# Patient Record
Sex: Female | Born: 1990 | Race: Black or African American | Hispanic: No | Marital: Single | Smoking: Smoker, current status unknown
Health system: Southern US, Community
[De-identification: ages and names within clinical notes are randomized; demographics above are authoritative.]

## PROBLEM LIST (undated history)

## (undated) DIAGNOSIS — F32A Depression, unspecified: Secondary | ICD-10-CM

## (undated) DIAGNOSIS — R569 Unspecified convulsions: Secondary | ICD-10-CM

## (undated) HISTORY — PX: NO PAST SURGERIES: SHX2092

## (undated) HISTORY — PX: INDUCED ABORTION: SHX677

---

## 2014-09-20 ENCOUNTER — Encounter (HOSPITAL_COMMUNITY): Payer: Self-pay

## 2014-09-20 ENCOUNTER — Emergency Department (HOSPITAL_COMMUNITY)
Admission: EM | Admit: 2014-09-20 | Discharge: 2014-09-20 | Disposition: A | Discharge status: Home / Self Care | Payer: Medicaid Other | Attending: Emergency Medicine | Admitting: Emergency Medicine

## 2014-09-20 ENCOUNTER — Emergency Department (HOSPITAL_COMMUNITY): Payer: Medicaid Other

## 2014-09-20 DIAGNOSIS — Z3202 Encounter for pregnancy test, result negative: Secondary | ICD-10-CM | POA: Diagnosis not present

## 2014-09-20 DIAGNOSIS — R1030 Lower abdominal pain, unspecified: Secondary | ICD-10-CM | POA: Insufficient documentation

## 2014-09-20 DIAGNOSIS — Z720 Tobacco use: Secondary | ICD-10-CM | POA: Diagnosis not present

## 2014-09-20 DIAGNOSIS — N939 Abnormal uterine and vaginal bleeding, unspecified: Secondary | ICD-10-CM | POA: Insufficient documentation

## 2014-09-20 DIAGNOSIS — R109 Unspecified abdominal pain: Secondary | ICD-10-CM

## 2014-09-20 DIAGNOSIS — R110 Nausea: Secondary | ICD-10-CM | POA: Diagnosis not present

## 2014-09-20 HISTORY — DX: Unspecified convulsions: R56.9

## 2014-09-20 LAB — URINE MICROSCOPIC-ADD ON

## 2014-09-20 LAB — COMPREHENSIVE METABOLIC PANEL
ALK PHOS: 81 U/L (ref 39–117)
ALT: 16 U/L (ref 0–35)
AST: 22 U/L (ref 0–37)
Albumin: 4.9 g/dL (ref 3.5–5.2)
Anion gap: 19 — ABNORMAL HIGH (ref 5–15)
BILIRUBIN TOTAL: 0.3 mg/dL (ref 0.3–1.2)
BUN: 5 mg/dL — ABNORMAL LOW (ref 6–23)
CO2: 22 mEq/L (ref 19–32)
Calcium: 10.8 mg/dL — ABNORMAL HIGH (ref 8.4–10.5)
Chloride: 102 mEq/L (ref 96–112)
Creatinine, Ser: 0.88 mg/dL (ref 0.50–1.10)
GFR calc non Af Amer: >90 mL/min (ref >90)
Glucose, Bld: 112 mg/dL — ABNORMAL HIGH (ref 70–99)
POTASSIUM: 3.5 meq/L — AB (ref 3.7–5.3)
Sodium: 143 mEq/L (ref 137–147)
TOTAL PROTEIN: 9.3 g/dL — AB (ref 6.0–8.3)

## 2014-09-20 LAB — CBC WITH DIFFERENTIAL/PLATELET
Basophils Absolute: 0 10*3/uL (ref 0.0–0.1)
Basophils Relative: 0 % (ref 0–1)
EOS PCT: 0 % (ref 0–5)
Eosinophils Absolute: 0 10*3/uL (ref 0.0–0.7)
HEMATOCRIT: 42.1 % (ref 36.0–46.0)
Hemoglobin: 13.8 g/dL (ref 12.0–15.0)
Lymphocytes Relative: 12 % (ref 12–46)
Lymphs Abs: 1.3 10*3/uL (ref 0.7–4.0)
MCH: 26.8 pg (ref 26.0–34.0)
MCHC: 32.8 g/dL (ref 30.0–36.0)
MCV: 81.9 fL (ref 78.0–100.0)
Monocytes Absolute: 0.3 10*3/uL (ref 0.1–1.0)
Monocytes Relative: 3 % (ref 3–12)
NEUTROS PCT: 85 % — AB (ref 43–77)
Neutro Abs: 9.4 10*3/uL — ABNORMAL HIGH (ref 1.7–7.7)
Platelets: 398 10*3/uL (ref 150–400)
RBC: 5.14 MIL/uL — AB (ref 3.87–5.11)
RDW: 17.7 % — ABNORMAL HIGH (ref 11.5–15.5)
WBC: 11 10*3/uL — ABNORMAL HIGH (ref 4.0–10.5)

## 2014-09-20 LAB — URINALYSIS, ROUTINE W REFLEX MICROSCOPIC
BILIRUBIN URINE: NEGATIVE
Glucose, UA: NEGATIVE mg/dL
Ketones, ur: 15 mg/dL — AB
Leukocytes, UA: NEGATIVE
Nitrite: NEGATIVE
PROTEIN: 30 mg/dL — AB
Specific Gravity, Urine: 1.025 (ref 1.005–1.030)
UROBILINOGEN UA: 0.2 mg/dL (ref 0.0–1.0)
pH: 8.5 — ABNORMAL HIGH (ref 5.0–8.0)

## 2014-09-20 LAB — WET PREP, GENITAL
Trich, Wet Prep: NONE SEEN
WBC WET PREP: NONE SEEN
Yeast Wet Prep HPF POC: NONE SEEN

## 2014-09-20 LAB — POC URINE PREG, ED: Preg Test, Ur: NEGATIVE

## 2014-09-20 MED ORDER — METRONIDAZOLE 500 MG PO TABS: 500.0000 mg | ORAL_TABLET | Freq: Two times a day (BID) | ORAL | Status: DC

## 2014-09-20 MED ORDER — HYDROMORPHONE HCL 1 MG/ML IJ SOLN
1.0000 mg | Freq: Once | INTRAMUSCULAR | Status: AC
  Administered 2014-09-20: 1 mg via INTRAVENOUS
  Filled 2014-09-20: qty 1

## 2014-09-20 MED ORDER — STERILE WATER FOR INJECTION IJ SOLN
INTRAMUSCULAR | Status: AC
  Administered 2014-09-20: 2.1 mL
  Filled 2014-09-20: qty 10

## 2014-09-20 MED ORDER — ONDANSETRON HCL 4 MG/2ML IJ SOLN
4.0000 mg | Freq: Once | INTRAMUSCULAR | Status: AC
  Administered 2014-09-20: 4 mg via INTRAVENOUS
  Filled 2014-09-20: qty 2

## 2014-09-20 MED ORDER — AZITHROMYCIN 250 MG PO TABS
1000.0000 mg | ORAL_TABLET | Freq: Once | ORAL | Status: AC
  Administered 2014-09-20: 1000 mg via ORAL
  Filled 2014-09-20: qty 4

## 2014-09-20 MED ORDER — SODIUM CHLORIDE 0.9 % IV BOLUS (SEPSIS)
1000.0000 mL | Freq: Once | INTRAVENOUS | Status: AC
  Administered 2014-09-20: 1000 mL via INTRAVENOUS

## 2014-09-20 MED ORDER — CEFTRIAXONE SODIUM 250 MG IJ SOLR
250.0000 mg | Freq: Once | INTRAMUSCULAR | Status: AC
  Administered 2014-09-20: 250 mg via INTRAMUSCULAR
  Filled 2014-09-20: qty 250

## 2014-09-20 MED ORDER — IOHEXOL 300 MG/ML  SOLN
100.0000 mL | Freq: Once | INTRAMUSCULAR | Status: AC | PRN
  Administered 2014-09-20: 100 mL via INTRAVENOUS

## 2014-09-20 MED ORDER — IOHEXOL 300 MG/ML  SOLN
50.0000 mL | Freq: Once | INTRAMUSCULAR | Status: AC | PRN
  Administered 2014-09-20: 50 mL via ORAL

## 2014-09-20 NOTE — ED Provider Notes (Signed)
CSN: [id number]     Arrival date & time 09/20/14  1705 History   First MD Initiated Contact with Patient 09/20/14 1736     Chief Complaint  Patient presents with  . Abdominal Pain     (Consider location/radiation/quality/duration/timing/severity/associated sxs/prior Treatment) Patient is a 23 y.o. female presenting with abdominal pain. The history is provided by the patient.  Abdominal Pain Associated symptoms: nausea and vaginal bleeding   Associated symptoms: no chest pain, no chills, no cough, no diarrhea, no dysuria, no fever, no shortness of breath, no sore throat, no vaginal discharge and no vomiting   pt c/o bilateral lower abdominal pain for the past couple days. Mod-severe. Constant, non radiating. Worse w movement. Denies hx same pain.  Nausea, no vomiting. Having normal bms. No dysuria or urgency. No back or flank pain. Having minimal vaginal bleeding, denies other discharge. States had normal period 2 weeks ago.  Denies hx pid/pelvic infection. No known std exposure. No hx ovarian cysts or endometriosis. Denies any prior abd surgery. No fever or chills.      Past Medical History  Diagnosis Date  . Seizures    Past Surgical History  Procedure Laterality Date  . Induced abortion     History reviewed. No pertinent family history. History  Substance Use Topics  . Smoking status: Current Every Day Smoker  . Smokeless tobacco: Not on file  . Alcohol Use: Yes     Comment: social   OB History    No data available     Review of Systems  Constitutional: Negative for fever and chills.  HENT: Negative for sore throat.   Eyes: Negative for redness.  Respiratory: Negative for cough and shortness of breath.   Cardiovascular: Negative for chest pain.  Gastrointestinal: Positive for nausea and abdominal pain. Negative for vomiting and diarrhea.  Endocrine: Negative for polyuria.  Genitourinary: Positive for vaginal bleeding. Negative for dysuria, flank pain and vaginal  discharge.  Musculoskeletal: Negative for back pain and neck pain.  Skin: Negative for rash.  Neurological: Negative for headaches.  Hematological: Does not bruise/bleed easily.  Psychiatric/Behavioral: Negative for confusion.      Allergies  Review of patient's allergies indicates no known allergies.  Home Medications   Prior to Admission medications   Not on File   BP 116/79 mmHg  Pulse 78  Temp(Src) 98.4 F (36.9 C) (Oral)  Resp 20  SpO2 100%  LMP 09/07/2014 Physical Exam  Constitutional: She is oriented to person, place, and time. She appears well-developed and well-nourished. No distress.  HENT:  Mouth/Throat: Oropharynx is clear and moist.  Eyes: Conjunctivae are normal. No scleral icterus.  Neck: Neck supple. No tracheal deviation present.  Cardiovascular: Normal rate, regular rhythm, normal heart sounds and intact distal pulses.  Exam reveals no gallop and no friction rub.   No murmur heard. Pulmonary/Chest: Effort normal and breath sounds normal. No respiratory distress.  Abdominal: Soft. Normal appearance and bowel sounds are normal. She exhibits no distension and no mass. There is tenderness. There is no rebound and no guarding.  Moderate bil lower abd tenderness.   Genitourinary:  No cva tenderness   Musculoskeletal: Normal range of motion. She exhibits no edema or tenderness.  Neurological: She is alert and oriented to person, place, and time.  Skin: Skin is warm and dry. No rash noted. She is not diaphoretic.  Psychiatric: She has a normal mood and affect.  Nursing note and vitals reviewed.   ED Course  Procedures (including critical  care time) Labs Review  Results for orders placed or performed during the hospital encounter of 09/20/14  Wet prep, genital  Result Value Ref Range   Yeast Wet Prep HPF POC NONE SEEN NONE SEEN   Trich, Wet Prep NONE SEEN NONE SEEN   Clue Cells Wet Prep HPF POC MODERATE (A) NONE SEEN   WBC, Wet Prep HPF POC NONE SEEN  NONE SEEN  CBC with Differential  Result Value Ref Range   WBC 11.0 (H) 4.0 - 10.5 K/uL   RBC 5.14 (H) 3.87 - 5.11 MIL/uL   Hemoglobin 13.8 12.0 - 15.0 g/dL   HCT 16.1 09.6 - 04.5 %   MCV 81.9 78.0 - 100.0 fL   MCH 26.8 26.0 - 34.0 pg   MCHC 32.8 30.0 - 36.0 g/dL   RDW 40.9 (H) 81.1 - 91.4 %   Platelets 398 150 - 400 K/uL   Neutrophils Relative % 85 (H) 43 - 77 %   Neutro Abs 9.4 (H) 1.7 - 7.7 K/uL   Lymphocytes Relative 12 12 - 46 %   Lymphs Abs 1.3 0.7 - 4.0 K/uL   Monocytes Relative 3 3 - 12 %   Monocytes Absolute 0.3 0.1 - 1.0 K/uL   Eosinophils Relative 0 0 - 5 %   Eosinophils Absolute 0.0 0.0 - 0.7 K/uL   Basophils Relative 0 0 - 1 %   Basophils Absolute 0.0 0.0 - 0.1 K/uL  Comprehensive metabolic panel  Result Value Ref Range   Sodium 143 137 - 147 mEq/L   Potassium 3.5 (L) 3.7 - 5.3 mEq/L   Chloride 102 96 - 112 mEq/L   CO2 22 19 - 32 mEq/L   Glucose, Bld 112 (H) 70 - 99 mg/dL   BUN 5 (L) 6 - 23 mg/dL   Creatinine, Ser 7.82 0.50 - 1.10 mg/dL   Calcium 95.6 (H) 8.4 - 10.5 mg/dL   Total Protein 9.3 (H) 6.0 - 8.3 g/dL   Albumin 4.9 3.5 - 5.2 g/dL   AST 22 0 - 37 U/L   ALT 16 0 - 35 U/L   Alkaline Phosphatase 81 39 - 117 U/L   Total Bilirubin 0.3 0.3 - 1.2 mg/dL   GFR calc non Af Amer >90 >90 mL/min   GFR calc Af Amer >90 >90 mL/min   Anion gap 19 (H) 5 - 15  Urinalysis, Routine w reflex microscopic  Result Value Ref Range   Color, Urine YELLOW YELLOW   APPearance CLOUDY (A) CLEAR   Specific Gravity, Urine 1.025 1.005 - 1.030   pH 8.5 (H) 5.0 - 8.0   Glucose, UA NEGATIVE NEGATIVE mg/dL   Hgb urine dipstick MODERATE (A) NEGATIVE   Bilirubin Urine NEGATIVE NEGATIVE   Ketones, ur 15 (A) NEGATIVE mg/dL   Protein, ur 30 (A) NEGATIVE mg/dL   Urobilinogen, UA 0.2 0.0 - 1.0 mg/dL   Nitrite NEGATIVE NEGATIVE   Leukocytes, UA NEGATIVE NEGATIVE  Urine microscopic-add on  Result Value Ref Range   Squamous Epithelial / LPF MANY (A) RARE   WBC, UA 0-2 <3 WBC/hpf    Bacteria, UA FEW (A) RARE   Urine-Other MUCOUS PRESENT   POC Urine Pregnancy, ED (do NOT order at [facility])  Result Value Ref Range   Preg Test, Ur NEGATIVE NEGATIVE   Ct Abdomen Pelvis W Contrast  09/20/2014   CLINICAL DATA:  Generalized abdominal pain.  EXAM: CT ABDOMEN AND PELVIS WITH CONTRAST  TECHNIQUE: Multidetector CT imaging of the abdomen and pelvis was  performed using the standard protocol following bolus administration of intravenous contrast.  CONTRAST:  50mL OMNIPAQUE IOHEXOL 300 MG/ML SOLN, 100mL OMNIPAQUE IOHEXOL 300 MG/ML SOLN  COMPARISON:  CT scan of Mar 08, 2014.  FINDINGS: Visualized lung bases appear normal. No significant osseous abnormality is noted.  No gallstones are noted. The liver, spleen and pancreas appear normal. Adrenal glands and kidneys appear normal. No hydronephrosis or renal obstruction is noted. No renal or ureteral calculi are noted. The appendix appears normal. There is no evidence of bowel obstruction. No abnormal fluid collection is noted. Urinary bladder appears normal. Uterus and ovaries appear normal. Minimal amount of free fluid is seen in the posterior cul-de-sac of the pelvis which most likely is physiologic. Minimally enlarged mesenteric lymph nodes are noted in the right lower quadrant most likely inflammatory in origin.  IMPRESSION: Minimally enlarged right lower quadrant mesenteric lymph nodes are noted most likely inflammatory in origin. No other significant abnormality seen in the abdomen or pelvis.   Electronically Signed   By: Roque LiasJames  Green M.D.   On: 09/20/2014 22:34       MDM  Iv ns. Dilaudid 1 mg iv. zofran iv.  Labs.  Reviewed nursing notes and prior charts for additional history.   +moderate vaginal discharge on exam.    Rocephin im, zithromax po.  Ct neg acute.   Wet prep w clue cells, mod d/c on exam.  No signific cmt on exam, no focal adn tenderness/mass. Pt afeb.  Will rx flagyl.  rec close pcp/gyn follow up.   Return  precautions discussed including return to ED if fever, vomiting, worsening or severe pain.    Suzi RootsKevin E Pricsilla Lindvall, MD 09/20/14 [phone]

## 2014-09-20 NOTE — ED Notes (Signed)
Bed: [id number] Expected date: 09/20/14 Expected time: 4:56 PM Means of arrival: Ambulance Comments: abd cramping

## 2014-09-20 NOTE — ED Notes (Signed)
Pt reports Nausea and vomiting since yesterday.  Sts she is bleeding vaginally.  States it cannot be her period because she went off that a week ago.  Pt sticking finger down throat to vomit.

## 2014-09-20 NOTE — ED Notes (Signed)
Pt states she has just finished having her period a week ago.  Pt states she and her "baby daddy"  Just had sex last night and night before.  Pt states hurting every since.Pt states she stopped bleeding and is now bleeding again.  No discharge or foul odor.  Had  Abortion 2 months ago.

## 2014-09-20 NOTE — ED Notes (Signed)
Per EMS, pt from home.  Pt c/o abdominal pain and emesis since yesterday.  Progressively worsened today.  Pepto, water at home with no relief.  Vitals: 128/78, hr 88, resp 22, 98% ra.  10/10 pain.

## 2014-09-20 NOTE — Discharge Instructions (Signed)
It was our pleasure to provide your ER care today - we hope that you feel better.  Take flagyl (antibiotic) as prescribed.  Do not drink alcohol when taking this antibiotic.  Follow up with primary care doctor or gyn doctor in the next couple days for recheck if symptoms fail to resolve.  Return to ER right away if worse, new symptoms, fevers, persistent vomiting, worsening or severe abdominal pain, other concern.   You were given pain medication in the ER - no driving for the next 4 hours.     Abdominal Pain Many things can cause abdominal pain. Usually, abdominal pain is not caused by a disease and will improve without treatment. It can often be observed and treated at home. Your health care provider will do a physical exam and possibly order blood tests and X-rays to help determine the seriousness of your pain. However, in many cases, more time must pass before a clear cause of the pain can be found. Before that point, your health care provider may not know if you need more testing or further treatment. HOME CARE INSTRUCTIONS  Monitor your abdominal pain for any changes. The following actions may help to alleviate any discomfort you are experiencing:  Only take over-the-counter or prescription medicines as directed by your health care provider.  Do not take laxatives unless directed to do so by your health care provider.  Try a clear liquid diet (broth, tea, or water) as directed by your health care provider. Slowly move to a bland diet as tolerated. SEEK MEDICAL CARE IF:  You have unexplained abdominal pain.  You have abdominal pain associated with nausea or diarrhea.  You have pain when you urinate or have a bowel movement.  You experience abdominal pain that wakes you in the night.  You have abdominal pain that is worsened or improved by eating food.  You have abdominal pain that is worsened with eating fatty foods.  You have a fever. SEEK IMMEDIATE MEDICAL CARE IF:    Your pain does not go away within 2 hours.  You keep throwing up (vomiting).  Your pain is felt only in portions of the abdomen, such as the right side or the left lower portion of the abdomen.  You pass bloody or black tarry stools. MAKE SURE YOU:  Understand these instructions.   Will watch your condition.   Will get help right away if you are not doing well or get worse.  Document Released: 07/05/2005 Document Revised: 09/30/2013 Document Reviewed: 06/04/2013 [facility] Patient Information 2015 [place], [place]. This information is not intended to replace advice given to you by your health care provider. Make sure you discuss any questions you have with your health care provider.     Abdominal Pain, Women Abdominal (stomach, pelvic, or belly) pain can be caused by many things. It is important to tell your doctor:  The location of the pain.  Does it come and go or is it present all the time?  Are there things that start the pain (eating certain foods, exercise)?  Are there other symptoms associated with the pain (fever, nausea, vomiting, diarrhea)? All of this is helpful to know when trying to find the cause of the pain. CAUSES   Stomach: virus or bacteria infection, or ulcer.  Intestine: appendicitis (inflamed appendix), regional ileitis (Crohn's disease), ulcerative colitis (inflamed colon), irritable bowel syndrome, diverticulitis (inflamed diverticulum of the colon), or cancer of the stomach or intestine.  Gallbladder disease or stones in the gallbladder.  Kidney disease, kidney stones, or infection.  Pancreas infection or cancer.  Fibromyalgia (pain disorder).  Diseases of the female organs:  Uterus: fibroid (non-cancerous) tumors or infection.  Fallopian tubes: infection or tubal pregnancy.  Ovary: cysts or tumors.  Pelvic adhesions (scar tissue).  Endometriosis (uterus lining tissue growing in the pelvis and on the pelvic organs).  Pelvic  congestion syndrome (female organs filling up with blood just before the menstrual period).  Pain with the menstrual period.  Pain with ovulation (producing an egg).  Pain with an IUD (intrauterine device, birth control) in the uterus.  Cancer of the female organs.  Functional pain (pain not caused by a disease, may improve without treatment).  Psychological pain.  Depression. DIAGNOSIS  Your doctor will decide the seriousness of your pain by doing an examination.  Blood tests.  X-rays.  Ultrasound.  CT scan (computed tomography, special type of X-ray).  MRI (magnetic resonance imaging).  Cultures, for infection.  Barium enema (dye inserted in the large intestine, to better view it with X-rays).  Colonoscopy (looking in intestine with a lighted tube).  Laparoscopy (minor surgery, looking in abdomen with a lighted tube).  Major abdominal exploratory surgery (looking in abdomen with a large incision). TREATMENT  The treatment will depend on the cause of the pain.   Many cases can be observed and treated at home.  Over-the-counter medicines recommended by your caregiver.  Prescription medicine.  Antibiotics, for infection.  Birth control pills, for painful periods or for ovulation pain.  Hormone treatment, for endometriosis.  Nerve blocking injections.  Physical therapy.  Antidepressants.  Counseling with a psychologist or psychiatrist.  Minor or major surgery. HOME CARE INSTRUCTIONS   Do not take laxatives, unless directed by your caregiver.  Take over-the-counter pain medicine only if ordered by your caregiver. Do not take aspirin because it can cause an upset stomach or bleeding.  Try a clear liquid diet (broth or water) as ordered by your caregiver. Slowly move to a bland diet, as tolerated, if the pain is related to the stomach or intestine.  Have a thermometer and take your temperature several times a day, and record it.  Bed rest and sleep,  if it helps the pain.  Avoid sexual intercourse, if it causes pain.  Avoid stressful situations.  Keep your follow-up appointments and tests, as your caregiver orders.  If the pain does not go away with medicine or surgery, you may try:  Acupuncture.  Relaxation exercises (yoga, meditation).  Group therapy.  Counseling. SEEK MEDICAL CARE IF:   You notice certain foods cause stomach pain.  Your home care treatment is not helping your pain.  You need stronger pain medicine.  You want your IUD removed.  You feel faint or lightheaded.  You develop nausea and vomiting.  You develop a rash.  You are having side effects or an allergy to your medicine. SEEK IMMEDIATE MEDICAL CARE IF:   Your pain does not go away or gets worse.  You have a fever.  Your pain is felt only in portions of the abdomen. The right side could possibly be appendicitis. The left lower portion of the abdomen could be colitis or diverticulitis.  You are passing blood in your stools (bright red or black tarry stools, with or without vomiting).  You have blood in your urine.  You develop chills, with or without a fever.  You pass out. MAKE SURE YOU:   Understand these instructions.  Will watch your condition.  Will get  help right away if you are not doing well or get worse. Document Released: 07/23/2007 Document Revised: 02/09/2014 Document Reviewed: 08/12/2009 [facility] Patient Information 2015 [place], [place]. This information is not intended to replace advice given to you by your health care provider. Make sure you discuss any questions you have with your health care provider.

## 2014-09-20 NOTE — ED Notes (Signed)
Patient transported to CT 

## 2014-09-21 LAB — GC/CHLAMYDIA PROBE AMP
CT Probe RNA: NEGATIVE
GC Probe RNA: NEGATIVE

## 2015-01-30 ENCOUNTER — Emergency Department (HOSPITAL_BASED_OUTPATIENT_CLINIC_OR_DEPARTMENT_OTHER)
Admission: EM | Admit: 2015-01-30 | Discharge: 2015-01-31 | Disposition: A | Discharge status: Home / Self Care | Payer: Medicaid Other | Attending: Emergency Medicine | Admitting: Emergency Medicine

## 2015-01-30 ENCOUNTER — Encounter (HOSPITAL_BASED_OUTPATIENT_CLINIC_OR_DEPARTMENT_OTHER): Payer: Self-pay

## 2015-01-30 DIAGNOSIS — R1084 Generalized abdominal pain: Secondary | ICD-10-CM

## 2015-01-30 DIAGNOSIS — Z79899 Other long term (current) drug therapy: Secondary | ICD-10-CM | POA: Diagnosis not present

## 2015-01-30 DIAGNOSIS — Z3202 Encounter for pregnancy test, result negative: Secondary | ICD-10-CM | POA: Diagnosis not present

## 2015-01-30 DIAGNOSIS — N390 Urinary tract infection, site not specified: Secondary | ICD-10-CM | POA: Diagnosis not present

## 2015-01-30 DIAGNOSIS — Z720 Tobacco use: Secondary | ICD-10-CM | POA: Insufficient documentation

## 2015-01-30 DIAGNOSIS — R112 Nausea with vomiting, unspecified: Secondary | ICD-10-CM | POA: Diagnosis not present

## 2015-01-30 DIAGNOSIS — G40909 Epilepsy, unspecified, not intractable, without status epilepticus: Secondary | ICD-10-CM | POA: Diagnosis not present

## 2015-01-30 NOTE — ED Notes (Signed)
Pt reports abdominal pain, vomiting since last night with minimal urination. States unable to keep po fluids down.

## 2015-01-31 ENCOUNTER — Emergency Department (HOSPITAL_BASED_OUTPATIENT_CLINIC_OR_DEPARTMENT_OTHER): Payer: Medicaid Other

## 2015-01-31 LAB — CBC WITH DIFFERENTIAL/PLATELET
Basophils Absolute: 0 10*3/uL (ref 0.0–0.1)
Basophils Relative: 0 % (ref 0–1)
EOS ABS: 0 10*3/uL (ref 0.0–0.7)
Eosinophils Relative: 0 % (ref 0–5)
HEMATOCRIT: 43.7 % (ref 36.0–46.0)
Hemoglobin: 14.6 g/dL (ref 12.0–15.0)
LYMPHS PCT: 22 % (ref 12–46)
Lymphs Abs: 2.8 10*3/uL (ref 0.7–4.0)
MCH: 28.5 pg (ref 26.0–34.0)
MCHC: 33.4 g/dL (ref 30.0–36.0)
MCV: 85.4 fL (ref 78.0–100.0)
MONO ABS: 1 10*3/uL (ref 0.1–1.0)
Monocytes Relative: 7 % (ref 3–12)
NEUTROS ABS: 9.1 10*3/uL — AB (ref 1.7–7.7)
NEUTROS PCT: 71 % (ref 43–77)
Platelets: 310 10*3/uL (ref 150–400)
RBC: 5.12 MIL/uL — AB (ref 3.87–5.11)
RDW: 15.7 % — ABNORMAL HIGH (ref 11.5–15.5)
WBC: 12.9 10*3/uL — ABNORMAL HIGH (ref 4.0–10.5)

## 2015-01-31 LAB — URINALYSIS, ROUTINE W REFLEX MICROSCOPIC
Bilirubin Urine: NEGATIVE
Glucose, UA: NEGATIVE mg/dL
Hgb urine dipstick: NEGATIVE
KETONES UR: NEGATIVE mg/dL
NITRITE: NEGATIVE
Protein, ur: NEGATIVE mg/dL
Specific Gravity, Urine: 1.016 (ref 1.005–1.030)
Urobilinogen, UA: 0.2 mg/dL (ref 0.0–1.0)
pH: 6.5 (ref 5.0–8.0)

## 2015-01-31 LAB — PREGNANCY, URINE: PREG TEST UR: NEGATIVE

## 2015-01-31 LAB — COMPREHENSIVE METABOLIC PANEL
ALBUMIN: 5 g/dL (ref 3.5–5.2)
ALK PHOS: 68 U/L (ref 39–117)
ALT: 17 U/L (ref 0–35)
ANION GAP: 8 (ref 5–15)
AST: 25 U/L (ref 0–37)
BILIRUBIN TOTAL: 1.1 mg/dL (ref 0.3–1.2)
BUN: 6 mg/dL (ref 6–23)
CO2: 27 mmol/L (ref 19–32)
Calcium: 9.8 mg/dL (ref 8.4–10.5)
Chloride: 102 mmol/L (ref 96–112)
Creatinine, Ser: 0.87 mg/dL (ref 0.50–1.10)
GFR calc non Af Amer: >90 mL/min (ref >90)
Glucose, Bld: 108 mg/dL — ABNORMAL HIGH (ref 70–99)
Potassium: 3 mmol/L — ABNORMAL LOW (ref 3.5–5.1)
Sodium: 137 mmol/L (ref 135–145)
TOTAL PROTEIN: 8.6 g/dL — AB (ref 6.0–8.3)

## 2015-01-31 LAB — URINE MICROSCOPIC-ADD ON

## 2015-01-31 LAB — LIPASE, BLOOD: LIPASE: 28 U/L (ref 11–59)

## 2015-01-31 MED ORDER — ONDANSETRON HCL 4 MG/2ML IJ SOLN
4.0000 mg | Freq: Once | INTRAMUSCULAR | Status: AC
  Administered 2015-01-31: 4 mg via INTRAVENOUS
  Filled 2015-01-31: qty 2

## 2015-01-31 MED ORDER — POTASSIUM CHLORIDE CRYS ER 20 MEQ PO TBCR
40.0000 meq | EXTENDED_RELEASE_TABLET | Freq: Once | ORAL | Status: AC
Start: 2015-01-31 — End: 2015-01-31
  Administered 2015-01-31: 40 meq via ORAL
  Filled 2015-01-31: qty 2

## 2015-01-31 MED ORDER — ONDANSETRON 4 MG PO TBDP: ORAL_TABLET | ORAL | Status: DC

## 2015-01-31 MED ORDER — DICYCLOMINE HCL 20 MG PO TABS: 20.0000 mg | ORAL_TABLET | Freq: Two times a day (BID) | ORAL | Status: DC | PRN

## 2015-01-31 MED ORDER — MORPHINE SULFATE 4 MG/ML IJ SOLN
4.0000 mg | Freq: Once | INTRAMUSCULAR | Status: AC
  Administered 2015-01-31: 4 mg via INTRAVENOUS
  Filled 2015-01-31: qty 1

## 2015-01-31 MED ORDER — SODIUM CHLORIDE 0.9 % IV BOLUS (SEPSIS)
1000.0000 mL | Freq: Once | INTRAVENOUS | Status: AC
  Administered 2015-01-31: 1000 mL via INTRAVENOUS

## 2015-01-31 MED ORDER — NITROFURANTOIN MONOHYD MACRO 100 MG PO CAPS: 100.0000 mg | ORAL_CAPSULE | Freq: Two times a day (BID) | ORAL | Status: DC

## 2015-01-31 NOTE — Discharge Instructions (Signed)
Abdominal Pain, Women °Abdominal (stomach, pelvic, or belly) pain can be caused by many things. It is important to tell your doctor: °· The location of the pain. °· Does it come and go or is it present all the time? °· Are there things that start the pain (eating certain foods, exercise)? °· Are there other symptoms associated with the pain (fever, nausea, vomiting, diarrhea)? °All of this is helpful to know when trying to find the cause of the pain. °CAUSES  °· Stomach: virus or bacteria infection, or ulcer. °· Intestine: appendicitis (inflamed appendix), regional ileitis (Crohn's disease), ulcerative colitis (inflamed colon), irritable bowel syndrome, diverticulitis (inflamed diverticulum of the colon), or cancer of the stomach or intestine. °· Gallbladder disease or stones in the gallbladder. °· Kidney disease, kidney stones, or infection. °· Pancreas infection or cancer. °· Fibromyalgia (pain disorder). °· Diseases of the female organs: °· Uterus: fibroid (non-cancerous) tumors or infection. °· Fallopian tubes: infection or tubal pregnancy. °· Ovary: cysts or tumors. °· Pelvic adhesions (scar tissue). °· Endometriosis (uterus lining tissue growing in the pelvis and on the pelvic organs). °· Pelvic congestion syndrome (female organs filling up with blood just before the menstrual period). °· Pain with the menstrual period. °· Pain with ovulation (producing an egg). °· Pain with an IUD (intrauterine device, birth control) in the uterus. °· Cancer of the female organs. °· Functional pain (pain not caused by a disease, may improve without treatment). °· Psychological pain. °· Depression. °DIAGNOSIS  °Your doctor will decide the seriousness of your pain by doing an examination. °· Blood tests. °· X-rays. °· Ultrasound. °· CT scan (computed tomography, special type of X-ray). °· MRI (magnetic resonance imaging). °· Cultures, for infection. °· Barium enema (dye inserted in the large intestine, to better view it with  X-rays). °· Colonoscopy (looking in intestine with a lighted tube). °· Laparoscopy (minor surgery, looking in abdomen with a lighted tube). °· Major abdominal exploratory surgery (looking in abdomen with a large incision). °TREATMENT  °The treatment will depend on the cause of the pain.  °· Many cases can be observed and treated at home. °· Over-the-counter medicines recommended by your caregiver. °· Prescription medicine. °· Antibiotics, for infection. °· Birth control pills, for painful periods or for ovulation pain. °· Hormone treatment, for endometriosis. °· Nerve blocking injections. °· Physical therapy. °· Antidepressants. °· Counseling with a psychologist or psychiatrist. °· Minor or major surgery. °HOME CARE INSTRUCTIONS  °· Do not take laxatives, unless directed by your caregiver. °· Take over-the-counter pain medicine only if ordered by your caregiver. Do not take aspirin because it can cause an upset stomach or bleeding. °· Try a clear liquid diet (broth or water) as ordered by your caregiver. Slowly move to a bland diet, as tolerated, if the pain is related to the stomach or intestine. °· Have a thermometer and take your temperature several times a day, and record it. °· Bed rest and sleep, if it helps the pain. °· Avoid sexual intercourse, if it causes pain. °· Avoid stressful situations. °· Keep your follow-up appointments and tests, as your caregiver orders. °· If the pain does not go away with medicine or surgery, you may try: °· Acupuncture. °· Relaxation exercises (yoga, meditation). °· Group therapy. °· Counseling. °SEEK MEDICAL CARE IF:  °· You notice certain foods cause stomach pain. °· Your home care treatment is not helping your pain. °· You need stronger pain medicine. °· You want your IUD removed. °· You feel faint or   lightheaded.  You develop nausea and vomiting.  You develop a rash.  You are having side effects or an allergy to your medicine. SEEK IMMEDIATE MEDICAL CARE IF:   Your  pain does not go away or gets worse.  You have a fever.  Your pain is felt only in portions of the abdomen. The right side could possibly be appendicitis. The left lower portion of the abdomen could be colitis or diverticulitis.  You are passing blood in your stools (bright red or black tarry stools, with or without vomiting).  You have blood in your urine.  You develop chills, with or without a fever.  You pass out. MAKE SURE YOU:   Understand these instructions.  Will watch your condition.  Will get help right away if you are not doing well or get worse. Document Released: 07/23/2007 Document Revised: 02/09/2014 Document Reviewed: 08/12/2009 [facility] Patient Information 2015 [place], [place]. This information is not intended to replace advice given to you by your health care provider. Make sure you discuss any questions you have with your health care provider.  Nausea and Vomiting Nausea is a sick feeling that often comes before throwing up (vomiting). Vomiting is a reflex where stomach contents come out of your mouth. Vomiting can cause severe loss of body fluids (dehydration). Children and elderly adults can become dehydrated quickly, especially if they also have diarrhea. Nausea and vomiting are symptoms of a condition or disease. It is important to find the cause of your symptoms. CAUSES   Direct irritation of the stomach lining. This irritation can result from increased acid production (gastroesophageal reflux disease), infection, food poisoning, taking certain medicines (such as nonsteroidal anti-inflammatory drugs), alcohol use, or tobacco use.  Signals from the brain.These signals could be caused by a headache, heat exposure, an inner ear disturbance, increased pressure in the brain from injury, infection, a tumor, or a concussion, pain, emotional stimulus, or metabolic problems.  An obstruction in the gastrointestinal tract (bowel obstruction).  Illnesses such as diabetes,  hepatitis, gallbladder problems, appendicitis, kidney problems, cancer, sepsis, atypical symptoms of a heart attack, or eating disorders.  Medical treatments such as chemotherapy and radiation.  Receiving medicine that makes you sleep (general anesthetic) during surgery. DIAGNOSIS Your caregiver may ask for tests to be done if the problems do not improve after a few days. Tests may also be done if symptoms are severe or if the reason for the nausea and vomiting is not clear. Tests may include:  Urine tests.  Blood tests.  Stool tests.  Cultures (to look for evidence of infection).  X-rays or other imaging studies. Test results can help your caregiver make decisions about treatment or the need for additional tests. TREATMENT You need to stay well hydrated. Drink frequently but in small amounts.You may wish to drink water, sports drinks, clear broth, or eat frozen ice pops or gelatin dessert to help stay hydrated.When you eat, eating slowly may help prevent nausea.There are also some antinausea medicines that may help prevent nausea. HOME CARE INSTRUCTIONS   Take all medicine as directed by your caregiver.  If you do not have an appetite, do not force yourself to eat. However, you must continue to drink fluids.  If you have an appetite, eat a normal diet unless your caregiver tells you differently.  Eat a variety of complex carbohydrates (rice, wheat, potatoes, bread), lean meats, yogurt, fruits, and vegetables.  Avoid high-fat foods because they are more difficult to digest.  Drink enough water and fluids  to keep your urine clear or pale yellow.  If you are dehydrated, ask your caregiver for specific rehydration instructions. Signs of dehydration may include:  Severe thirst.  Dry lips and mouth.  Dizziness.  Dark urine.  Decreasing urine frequency and amount.  Confusion.  Rapid breathing or pulse. SEEK IMMEDIATE MEDICAL CARE IF:   You have blood or brown flecks  (like coffee grounds) in your vomit.  You have black or bloody stools.  You have a severe headache or stiff neck.  You are confused.  You have severe abdominal pain.  You have chest pain or trouble breathing.  You do not urinate at least once every 8 hours.  You develop cold or clammy skin.  You continue to vomit for longer than 24 to 48 hours.  You have a fever. MAKE SURE YOU:   Understand these instructions.  Will watch your condition.  Will get help right away if you are not doing well or get worse. Document Released: 09/25/2005 Document Revised: 12/18/2011 Document Reviewed: 02/22/2011 [facility] Patient Information 2015 [place]. This information is not intended to replace advice given to you by your health care provider. Make sure you discuss any questions you have with your health care provider.  Urinary Tract Infection Urinary tract infections (UTIs) can develop anywhere along your urinary tract. Your urinary tract is your body's drainage system for removing wastes and extra water. Your urinary tract includes two kidneys, two ureters, a bladder, and a urethra. Your kidneys are a pair of bean-shaped organs. Each kidney is about the size of your fist. They are located below your ribs, one on each side of your spine. CAUSES Infections are caused by microbes, which are microscopic organisms, including fungi, viruses, and bacteria. These organisms are so small that they can only be seen through a microscope. Bacteria are the microbes that most commonly cause UTIs. SYMPTOMS  Symptoms of UTIs may vary by age and gender of the patient and by the location of the infection. Symptoms in young women typically include a frequent and intense urge to urinate and a painful, burning feeling in the bladder or urethra during urination. Older women and men are more likely to be tired, shaky, and weak and have muscle aches and abdominal pain. A fever may mean the infection is in your  kidneys. Other symptoms of a kidney infection include pain in your back or sides below the ribs, nausea, and vomiting. DIAGNOSIS To diagnose a UTI, your caregiver will ask you about your symptoms. Your caregiver also will ask to provide a urine sample. The urine sample will be tested for bacteria and white blood cells. White blood cells are made by your body to help fight infection. TREATMENT  Typically, UTIs can be treated with medication. Because most UTIs are caused by a bacterial infection, they usually can be treated with the use of antibiotics. The choice of antibiotic and length of treatment depend on your symptoms and the type of bacteria causing your infection. HOME CARE INSTRUCTIONS  If you were prescribed antibiotics, take them exactly as your caregiver instructs you. Finish the medication even if you feel better after you have only taken some of the medication.  Drink enough water and fluids to keep your urine clear or pale yellow.  Avoid caffeine, tea, and carbonated beverages. They tend to irritate your bladder.  Empty your bladder often. Avoid holding urine for long periods of time.  Empty your bladder before and after sexual intercourse.  After a bowel  women should cleanse from front to back. Use each tissue only once. °SEEK MEDICAL CARE IF:  °· You have back pain. °· You develop a fever. °· Your symptoms do not begin to resolve within 3 days. °SEEK IMMEDIATE MEDICAL CARE IF:  °· You have severe back pain or lower abdominal pain. °· You develop chills. °· You have nausea or vomiting. °· You have continued burning or discomfort with urination. °MAKE SURE YOU:  °· Understand these instructions. °· Will watch your condition. °· Will get help right away if you are not doing well or get worse. °Document Released: 07/05/2005 Document Revised: 03/26/2012 Document Reviewed: 11/03/2011 °Exit[employer]® Patient Information ©2015 [employer]. This information is not intended to replace advice  given to you by your health care provider. Make sure you discuss any questions you have with your health care provider. ° °

## 2015-01-31 NOTE — ED Provider Notes (Signed)
CSN: [id number]     Arrival date & time 01/30/15  2233 History  This chart was scribed for Loren Racer, MD by [place], ED Scribe. The patient was seen in [facility]. The patient's care was started at 12:32 AM.     Chief Complaint  Patient presents with  . Emesis      Patient is a 24 y.o. female presenting with vomiting. The history is provided by the patient. No language interpreter was used.  Emesis Associated symptoms: abdominal pain   Associated symptoms: no chills, no diarrhea and no myalgias     HPI Comments: Cintia Gleed is a 24 y.o. female who presents to the Emergency Department complaining of left-sided abdominal pain onset last night with vomiting (4-5 episodes). No complaints of diarrhea, dysuria, increased frequency in urination, or vaginal discharge. Last menstrual cycle ended on Wednesday, 01/27/2015. Pt states she is unable to keep fluids down. History of induced abortion. Pt is current everyday smoker.   She has similar episode of this abdominal pain in December. Had normal workup at that time. Past Medical History  Diagnosis Date  . Seizures    Past Surgical History  Procedure Laterality Date  . Induced abortion     History reviewed. No pertinent family history. History  Substance Use Topics  . Smoking status: Current Every Day Smoker  . Smokeless tobacco: Not on file  . Alcohol Use: Yes     Comment: social   OB History    No data available     Review of Systems  Constitutional: Negative for fever and chills.  Gastrointestinal: Positive for nausea, vomiting and abdominal pain. Negative for diarrhea, constipation and blood in stool.  Genitourinary: Negative for dysuria, frequency, vaginal discharge, vaginal pain and pelvic pain.  Musculoskeletal: Negative for myalgias, back pain, neck pain and neck stiffness.  Skin: Negative for pallor, rash and wound.  Neurological: Negative for dizziness, syncope, weakness, light-headedness and numbness.  All  other systems reviewed and are negative.     Allergies  Review of patient's allergies indicates no known allergies.  Home Medications   Prior to Admission medications   Medication Sig Start Date End Date Taking? Authorizing Provider  levETIRAcetam (KEPPRA) 500 MG tablet Take 500 mg by mouth 2 (two) times daily.   Yes Historical Provider, MD  dicyclomine (BENTYL) 20 MG tablet Take 1 tablet (20 mg total) by mouth 2 (two) times daily as needed for spasms. 01/31/15   Loren Racer, MD  metroNIDAZOLE (FLAGYL) 500 MG tablet Take 1 tablet (500 mg total) by mouth 2 (two) times daily. 09/20/14   Cathren Laine, MD  nitrofurantoin, macrocrystal-monohydrate, (MACROBID) 100 MG capsule Take 1 capsule (100 mg total) by mouth 2 (two) times daily. 01/31/15   Loren Racer, MD  ondansetron (ZOFRAN ODT) 4 MG disintegrating tablet  ODT q4 hours prn nausea/vomit 01/31/15   Loren Racer, MD   BP 121/87 mmHg  Pulse 69  Temp(Src) 99.1 F (37.3 C) (Oral)  Resp 20  Ht  (1.575 m)  Wt 139 lb (63.05 kg)  BMI 25.42 kg/m2  SpO2 98%  LMP 01/20/2015 Physical Exam  Constitutional: She is oriented to person, place, and time. She appears well-developed and well-nourished. No distress.  HENT:  Head: Normocephalic and atraumatic.  Mouth/Throat: Oropharynx is clear and moist.  Eyes: EOM are normal. Pupils are equal, round, and reactive to light.  Neck: Normal range of motion. Neck supple.  Cardiovascular: Normal rate and regular rhythm.   Pulmonary/Chest: Effort normal and  breath sounds normal. No respiratory distress. She has no wheezes. She has no rales. She exhibits no tenderness.  Abdominal: Soft. Bowel sounds are normal. She exhibits no distension and no mass. There is tenderness (mild diffuse tenderness to palpation.). There is no rebound and no guarding.  Musculoskeletal: Normal range of motion. She exhibits no edema or tenderness.  No CVA tenderness bilaterally.  Neurological: She is alert and  oriented to person, place, and time.  Skin: Skin is warm and dry. No rash noted. No erythema.  Psychiatric: She has a normal mood and affect. Her behavior is normal.  Nursing note and vitals reviewed.   ED Course  Procedures (including critical care time) Labs Review Labs Reviewed  URINALYSIS, ROUTINE W REFLEX MICROSCOPIC - Abnormal; Notable for the following:    APPearance CLOUDY (*)    Leukocytes, UA SMALL (*)    All other components within normal limits  CBC WITH DIFFERENTIAL/PLATELET - Abnormal; Notable for the following:    WBC 12.9 (*)    RBC 5.12 (*)    RDW 15.7 (*)    Neutro Abs 9.1 (*)    All other components within normal limits  COMPREHENSIVE METABOLIC PANEL - Abnormal; Notable for the following:    Potassium 3.0 (*)    Glucose, Bld 108 (*)    Total Protein 8.6 (*)    All other components within normal limits  URINE MICROSCOPIC-ADD ON - Abnormal; Notable for the following:    Squamous Epithelial / LPF MANY (*)    Bacteria, UA MANY (*)    All other components within normal limits  PREGNANCY, URINE  LIPASE, BLOOD    Imaging Review Dg Abd 1 View  01/31/2015   CLINICAL DATA:  Left-sided abdominal pain beginning yesterday. Emesis.  EXAM: ABDOMEN - 1 VIEW  COMPARISON:  None.  FINDINGS: The bowel gas pattern is normal. No radio-opaque calculi or other significant radiographic abnormality are seen.  IMPRESSION: Negative.   Electronically Signed   By: Davonna BellingJohn  Curnes M.D.   On: 01/31/2015 02:44     EKG Interpretation None     Medications  sodium chloride 0.9 % bolus 1,000 mL (0 mLs Intravenous Stopped 01/31/15 0305)  ondansetron (ZOFRAN) injection 4 mg (4 mg Intravenous Given 01/31/15 0113)  morphine 4 MG/ML injection 4 mg (4 mg Intravenous Given 01/31/15 0113)  potassium chloride SA (K-DUR,KLOR-CON) CR tablet 40 mEq (40 mEq Oral Given 01/31/15 0303)    12:35 AM- Treatment plan was discussed with patient who verbalizes understanding and agrees.   MDM   Final diagnoses:   Generalized abdominal pain  Non-intractable vomiting with nausea, vomiting of unspecified type  UTI (lower urinary tract infection)    I personally performed the services described in this documentation, which was scribed in my presence. The recorded information has been reviewed and is accurate.  Tenderness that she feels much better after medication. No further vomiting in the emergency department. Tolerating fluids by mouth. X-ray without acute findings. Normal laboratory workup. Abdominal exam is benign. Questionable UTI and UA. Do not believe further imaging is warranted at this point especially given recent CT with negative results. Return precautions given. Patient's positive follow-up with a gastrologist that the symptoms persist.   Loren Raceravid Maclin Guerrette, MD 01/31/15 [phone]

## 2015-02-28 ENCOUNTER — Encounter (HOSPITAL_BASED_OUTPATIENT_CLINIC_OR_DEPARTMENT_OTHER): Payer: Self-pay | Admitting: *Deleted

## 2015-02-28 ENCOUNTER — Emergency Department (HOSPITAL_BASED_OUTPATIENT_CLINIC_OR_DEPARTMENT_OTHER): Payer: Medicaid Other

## 2015-02-28 ENCOUNTER — Emergency Department (HOSPITAL_BASED_OUTPATIENT_CLINIC_OR_DEPARTMENT_OTHER)
Admission: EM | Admit: 2015-02-28 | Discharge: 2015-02-28 | Disposition: A | Discharge status: Home / Self Care | Payer: Medicaid Other | Attending: Emergency Medicine | Admitting: Emergency Medicine

## 2015-02-28 DIAGNOSIS — S02401A Maxillary fracture, unspecified, initial encounter for closed fracture: Secondary | ICD-10-CM | POA: Diagnosis not present

## 2015-02-28 DIAGNOSIS — Y998 Other external cause status: Secondary | ICD-10-CM | POA: Diagnosis not present

## 2015-02-28 DIAGNOSIS — Z792 Long term (current) use of antibiotics: Secondary | ICD-10-CM | POA: Diagnosis not present

## 2015-02-28 DIAGNOSIS — S0990XA Unspecified injury of head, initial encounter: Secondary | ICD-10-CM | POA: Insufficient documentation

## 2015-02-28 DIAGNOSIS — G40909 Epilepsy, unspecified, not intractable, without status epilepticus: Secondary | ICD-10-CM | POA: Diagnosis not present

## 2015-02-28 DIAGNOSIS — Y9389 Activity, other specified: Secondary | ICD-10-CM | POA: Diagnosis not present

## 2015-02-28 DIAGNOSIS — Z720 Tobacco use: Secondary | ICD-10-CM | POA: Diagnosis not present

## 2015-02-28 DIAGNOSIS — Z79899 Other long term (current) drug therapy: Secondary | ICD-10-CM | POA: Diagnosis not present

## 2015-02-28 DIAGNOSIS — Y9289 Other specified places as the place of occurrence of the external cause: Secondary | ICD-10-CM | POA: Diagnosis not present

## 2015-02-28 DIAGNOSIS — S0993XA Unspecified injury of face, initial encounter: Secondary | ICD-10-CM | POA: Diagnosis present

## 2015-02-28 MED ORDER — HYDROCODONE-ACETAMINOPHEN 5-325 MG PO TABS
2.0000 | ORAL_TABLET | Freq: Once | ORAL | Status: AC
  Administered 2015-02-28: 2 via ORAL
  Filled 2015-02-28: qty 2

## 2015-02-28 MED ORDER — CEPHALEXIN 500 MG PO CAPS: 500.0000 mg | ORAL_CAPSULE | Freq: Four times a day (QID) | ORAL | Status: DC

## 2015-02-28 MED ORDER — KETOROLAC TROMETHAMINE 30 MG/ML IJ SOLN
30.0000 mg | Freq: Once | INTRAMUSCULAR | Status: AC
  Administered 2015-02-28: 30 mg via INTRAVENOUS
  Filled 2015-02-28: qty 1

## 2015-02-28 MED ORDER — IOHEXOL 300 MG/ML  SOLN
75.0000 mL | Freq: Once | INTRAMUSCULAR | Status: AC | PRN
  Administered 2015-02-28: 100 mL via INTRAVENOUS

## 2015-02-28 MED ORDER — OXYCODONE-ACETAMINOPHEN 5-325 MG PO TABS: 1.0000 | ORAL_TABLET | Freq: Four times a day (QID) | ORAL | Status: DC | PRN

## 2015-02-28 NOTE — ED Notes (Signed)
Pt in CT at this time.
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
## 2015-02-28 NOTE — ED Notes (Signed)
Back from b/r, steady gait, to CT via stretcher.
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
## 2015-02-28 NOTE — Discharge Instructions (Signed)
Percocet as prescribed as needed for pain.  Apply ice to the swollen areas for 20 minutes of every 2 hours for the next 2 days while awake.  Follow-up with Dr. Kelly SplinterSanger on Monday. Her contact information has been provided in this discharge summary. Call to arrange this appointment.   Facial Fracture A facial fracture is a break in one of the bones of your face. HOME CARE INSTRUCTIONS   Protect the injured part of your face until it is healed.  Do not participate in activities which give chance for re-injury until your doctor approves.  Gently wash and dry your face.  Wear head and facial protection while riding a bicycle, motorcycle, or snowmobile. SEEK MEDICAL CARE IF:   An oral temperature above 102 F (38.9 C) develops.  You have severe headaches or notice changes in your vision.  You have new numbness or tingling in your face.  You develop nausea (feeling sick to your stomach), vomiting or a stiff neck. SEEK IMMEDIATE MEDICAL CARE IF:   You develop difficulty seeing or experience double vision.  You become dizzy, lightheaded, or faint.  You develop trouble speaking, breathing, or swallowing.  You have a watery discharge from your nose or ear. MAKE SURE YOU:   Understand these instructions.  Will watch your condition.  Will get help right away if you are not doing well or get worse. Document Released: 09/25/2005 Document Revised: 12/18/2011 Document Reviewed: 05/14/2008 [facility] Patient Information 2015 [place], [place]. This information is not intended to replace advice given to you by your health care provider. Make sure you discuss any questions you have with your health care provider.

## 2015-02-28 NOTE — ED Notes (Signed)
Dr. Delo into see pt 

## 2015-04-16 ENCOUNTER — Emergency Department (HOSPITAL_BASED_OUTPATIENT_CLINIC_OR_DEPARTMENT_OTHER): Payer: Medicaid Other

## 2015-04-16 ENCOUNTER — Encounter (HOSPITAL_BASED_OUTPATIENT_CLINIC_OR_DEPARTMENT_OTHER): Payer: Self-pay | Admitting: *Deleted

## 2015-04-16 ENCOUNTER — Emergency Department (HOSPITAL_BASED_OUTPATIENT_CLINIC_OR_DEPARTMENT_OTHER)
Admission: EM | Admit: 2015-04-16 | Discharge: 2015-04-16 | Disposition: A | Discharge status: Home / Self Care | Payer: Medicaid Other | Attending: Emergency Medicine | Admitting: Emergency Medicine

## 2015-04-16 DIAGNOSIS — S0990XA Unspecified injury of head, initial encounter: Secondary | ICD-10-CM | POA: Diagnosis not present

## 2015-04-16 DIAGNOSIS — W01198A Fall on same level from slipping, tripping and stumbling with subsequent striking against other object, initial encounter: Secondary | ICD-10-CM | POA: Insufficient documentation

## 2015-04-16 DIAGNOSIS — Y9389 Activity, other specified: Secondary | ICD-10-CM | POA: Insufficient documentation

## 2015-04-16 DIAGNOSIS — Y9289 Other specified places as the place of occurrence of the external cause: Secondary | ICD-10-CM | POA: Diagnosis not present

## 2015-04-16 DIAGNOSIS — Z79899 Other long term (current) drug therapy: Secondary | ICD-10-CM | POA: Diagnosis not present

## 2015-04-16 DIAGNOSIS — Z720 Tobacco use: Secondary | ICD-10-CM | POA: Diagnosis not present

## 2015-04-16 DIAGNOSIS — G40909 Epilepsy, unspecified, not intractable, without status epilepticus: Secondary | ICD-10-CM | POA: Diagnosis not present

## 2015-04-16 DIAGNOSIS — R569 Unspecified convulsions: Secondary | ICD-10-CM

## 2015-04-16 DIAGNOSIS — Y998 Other external cause status: Secondary | ICD-10-CM | POA: Insufficient documentation

## 2015-04-16 LAB — CBC WITH DIFFERENTIAL/PLATELET
BASOS ABS: 0 10*3/uL (ref 0.0–0.1)
Basophils Relative: 0 % (ref 0–1)
Eosinophils Absolute: 0.2 10*3/uL (ref 0.0–0.7)
Eosinophils Relative: 2 % (ref 0–5)
HCT: 37.5 % (ref 36.0–46.0)
Hemoglobin: 12.2 g/dL (ref 12.0–15.0)
LYMPHS ABS: 1.5 10*3/uL (ref 0.7–4.0)
LYMPHS PCT: 12 % (ref 12–46)
MCH: 28.8 pg (ref 26.0–34.0)
MCHC: 32.5 g/dL (ref 30.0–36.0)
MCV: 88.7 fL (ref 78.0–100.0)
Monocytes Absolute: 0.8 10*3/uL (ref 0.1–1.0)
Monocytes Relative: 7 % (ref 3–12)
NEUTROS PCT: 79 % — AB (ref 43–77)
Neutro Abs: 9.7 10*3/uL — ABNORMAL HIGH (ref 1.7–7.7)
PLATELETS: 257 10*3/uL (ref 150–400)
RBC: 4.23 MIL/uL (ref 3.87–5.11)
RDW: 14.6 % (ref 11.5–15.5)
WBC: 12.2 10*3/uL — AB (ref 4.0–10.5)

## 2015-04-16 LAB — BASIC METABOLIC PANEL
ANION GAP: 7 (ref 5–15)
BUN: 12 mg/dL (ref 6–20)
CHLORIDE: 106 mmol/L (ref 101–111)
CO2: 26 mmol/L (ref 22–32)
Calcium: 9.1 mg/dL (ref 8.9–10.3)
Creatinine, Ser: 1.07 mg/dL — ABNORMAL HIGH (ref 0.44–1.00)
GFR calc Af Amer: >60 mL/min (ref >60)
Glucose, Bld: 67 mg/dL (ref 65–99)
Potassium: 3.7 mmol/L (ref 3.5–5.1)
Sodium: 139 mmol/L (ref 135–145)

## 2015-04-16 MED ORDER — LEVETIRACETAM 500 MG PO TABS
500.0000 mg | ORAL_TABLET | Freq: Once | ORAL | Status: DC
  Filled 2015-04-16: qty 1

## 2015-04-16 MED ORDER — HYDROCODONE-ACETAMINOPHEN 5-325 MG PO TABS
1.0000 | ORAL_TABLET | Freq: Once | ORAL | Status: AC
Start: 2015-04-16 — End: 2015-04-16
  Administered 2015-04-16: 1 via ORAL
  Filled 2015-04-16: qty 1

## 2015-04-16 MED ORDER — LORAZEPAM 1 MG PO TABS
1.0000 mg | ORAL_TABLET | Freq: Once | ORAL | Status: AC
  Administered 2015-04-16: 1 mg via ORAL
  Filled 2015-04-16: qty 1

## 2015-04-16 NOTE — ED Notes (Signed)
Pt ambulated to bathroom w/o difficulty. Given Drink

## 2015-04-16 NOTE — ED Notes (Signed)
MD at bedside. 

## 2015-04-16 NOTE — ED Notes (Signed)
Pt states she missed a dose. Has a neurologist. Has taken a dose after the seizure. EDP aware.

## 2015-04-16 NOTE — ED Notes (Signed)
Mother states witnessed seizure x 30 mins ago lasting 3 mins pt states missed tonight's dose of keppra

## 2015-04-16 NOTE — ED Provider Notes (Signed)
CSN: [id number]     Arrival date & time 04/16/15  0019 History   None    Chief Complaint  Patient presents with  . Seizures     (Consider location/radiation/quality/duration/timing/severity/associated sxs/prior Treatment) HPI  This is a 24 year old female with a known history of seizures who presents with seizure activity. Per the patient and her mother, she had 2 seizures prior to arrival.  These were witnessed by her sister. Patient does not recall events leading up to or just after the seizures. She reportedly fell and hit her head. She reports headache. She otherwise is nonfocal. The weakness, numbness, or tingling anywhere. Reports that she missed her Keppra dose earlier this evening. However, she did take this dose after her seizure. She follows with a neurologist. She states that she had been fairly well controlled until last fall when she had increased her Keppra dose to 750 twice a day. Denies any recent illnesses.  Past Medical History  Diagnosis Date  . Seizures    Past Surgical History  Procedure Laterality Date  . Induced abortion     History reviewed. No pertinent family history. History  Substance Use Topics  . Smoking status: Current Every Day Smoker  . Smokeless tobacco: Not on file  . Alcohol Use: Yes     Comment: social   OB History    No data available     Review of Systems  Constitutional: Negative for fever.  Respiratory: Negative for chest tightness and shortness of breath.   Cardiovascular: Negative for chest pain.  Gastrointestinal: Negative for nausea, vomiting and abdominal pain.  Genitourinary: Negative for dysuria.  Musculoskeletal: Negative for back pain.  Skin: Negative for wound.  Neurological: Positive for seizures and headaches. Negative for weakness and numbness.  Psychiatric/Behavioral: Negative for confusion.  All other systems reviewed and are negative.     Allergies  Review of patient's allergies indicates no known  allergies.  Home Medications   Prior to Admission medications   Medication Sig Start Date End Date Taking? Authorizing Provider  levETIRAcetam (KEPPRA) 500 MG tablet Take 500 mg by mouth 2 (two) times daily.    Historical Provider, MD  ondansetron (ZOFRAN ODT) 4 MG disintegrating tablet  ODT q4 hours prn nausea/vomit 01/31/15   Loren Racer, MD  oxyCODONE-acetaminophen (PERCOCET) 5-325 MG per tablet Take 1-2 tablets by mouth every 6 (six) hours as needed. 02/28/15   Geoffery Lyons, MD   BP 100/57 mmHg  Pulse 86  Temp(Src) 98.7 F (37.1 C)  Resp 16  Ht  (1.753 m)  Wt 130 lb (58.968 kg)  BMI 19.19 kg/m2  SpO2 100%  LMP 04/02/2015 Physical Exam  Constitutional: She is oriented to person, place, and time. She appears well-developed and well-nourished.  HENT:  Head: Normocephalic and atraumatic.  Right Ear: External ear normal.  Left Ear: External ear normal.  Mouth/Throat: Oropharynx is clear and moist.  Eyes: EOM are normal. Pupils are equal, round, and reactive to light.  Neck: Neck supple.  Cardiovascular: Normal rate, regular rhythm and normal heart sounds.   No murmur heard. Pulmonary/Chest: Effort normal and breath sounds normal. No respiratory distress. She has no wheezes.  Abdominal: Soft. Bowel sounds are normal. There is no tenderness. There is no rebound.  Neurological: She is alert and oriented to person, place, and time.  Cranial nerves II through XII intact, 5 out of 5 strength in all 4 extremities, normal gait  Skin: Skin is warm and dry.  Psychiatric: She has a normal  mood and affect.  Nursing note and vitals reviewed.   ED Course  Procedures (including critical care time) Labs Review Labs Reviewed  CBC WITH DIFFERENTIAL/PLATELET - Abnormal; Notable for the following:    WBC 12.2 (*)    Neutrophils Relative % 79 (*)    Neutro Abs 9.7 (*)    All other components within normal limits  BASIC METABOLIC PANEL - Abnormal; Notable for the following:     Creatinine, Ser 1.07 (*)    All other components within normal limits    Imaging Review Ct Head Wo Contrast  04/16/2015   CLINICAL DATA:  Status post fall; hit left side of head, with headache. Seizure for 3 minutes. Initial encounter.  EXAM: CT HEAD WITHOUT CONTRAST  TECHNIQUE: Contiguous axial images were obtained from the base of the skull through the vertex without intravenous contrast.  COMPARISON:  CT of the head performed 02/28/2015  FINDINGS: There is no evidence of acute infarction, or intra- or extra-axial hemorrhage on CT.  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  The posterior fossa, including the cerebellum, brainstem and fourth ventricle, is within normal limits. The third and lateral ventricles, and basal ganglia are unremarkable in appearance. The cerebral hemispheres demonstrate grossly normal gray-white differentiation. No mass effect or midline shift is seen.  There is no evidence of fracture; visualized osseous structures are unremarkable in appearance. The visualized portions of the orbits are within normal limits. The paranasal sinuses and mastoid air cells are well-aerated. No significant soft tissue abnormalities are seen.  IMPRESSION: 1. No acute intracranial pathology seen on CT. 2. 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   Electronically Signed   By: Roanna RaiderJeffery  Chang M.D.   On: 04/16/2015 01:44     EKG Interpretation None      MDM   Final diagnoses:  Seizure    Patient presents with seizure activity. She missed a dose of her Keppra this evening. She is back to baseline. Witnessed fall and patient reports headache. For this reason will obtain CT. Basic labwork obtained to evaluate for metabolic abnormality. Basic labwork is reassuring. CT scan of the head is unchanged in negative for acute traumatic injury. Patient was able to  ambulate without difficulty. She has had no recurrent seizure activity in the ER. She was given 1 mg of Ativan to prevent further seizure activity. Patient was instructed to follow-up with her primary neurologist for medication adjustment. She was given Norco for her headache.  After history, exam, and medical workup I feel the patient has been appropriately medically screened and is safe for discharge home. Pertinent diagnoses were discussed with the patient. Patient was given return precautions.     Shon Batonourtney F Craige Patel, MD 04/16/15 [id number]

## 2015-04-16 NOTE — Discharge Instructions (Signed)
You were seen today for seizure activity. Your workup is reassuring. You need follow-up with your neurologist regarding changes in your medication. You should not drive.  Seizure, Adult A seizure is abnormal electrical activity in the brain. Seizures usually last from 30 seconds to 2 minutes. There are various types of seizures. Before a seizure, you may have a warning sensation (aura) that a seizure is about to occur. An aura may include the following symptoms:   Fear or anxiety.  Nausea.  Feeling like the room is spinning (vertigo).  Vision changes, such as seeing flashing lights or spots. Common symptoms during a seizure include:  A change in attention or behavior (altered mental status).  Convulsions with rhythmic jerking movements.  Drooling.  Rapid eye movements.  Grunting.  Loss of bladder and bowel control.  Bitter taste in the mouth.  Tongue biting. After a seizure, you may feel confused and sleepy. You may also have an injury resulting from convulsions during the seizure. HOME CARE INSTRUCTIONS   If you are given medicines, take them exactly as prescribed by your health care provider.  Keep all follow-up appointments as directed by your health care provider.  Do not swim or drive or engage in risky activity during which a seizure could cause further injury to you or others until your health care provider says it is OK.  Get adequate rest.  Teach friends and family what to do if you have a seizure. They should:  Lay you on the ground to prevent a fall.  Put a cushion under your head.  Loosen any tight clothing around your neck.  Turn you on your side. If vomiting occurs, this helps keep your airway clear.  Stay with you until you recover.  Know whether or not you need emergency care. SEEK IMMEDIATE MEDICAL CARE IF:  The seizure lasts longer than 5 minutes.  The seizure is severe or you do not wake up immediately after the seizure.  You have an  altered mental status after the seizure.  You are having more frequent or worsening seizures. Someone should drive you to the emergency department or call local emergency services (911 in [place] MAKE SURE YOU:  Understand these instructions.  Will watch your condition.  Will get help right away if you are not doing well or get worse. Document Released: 09/22/2000 Document Revised: 07/16/2013 Document Reviewed: 05/07/2013 [facility] Patient Information 2015 [place], [place]. This information is not intended to replace advice given to you by your health care provider. Make sure you discuss any questions you have with your health care provider.

## 2015-05-26 ENCOUNTER — Emergency Department (HOSPITAL_BASED_OUTPATIENT_CLINIC_OR_DEPARTMENT_OTHER)
Admission: EM | Admit: 2015-05-26 | Discharge: 2015-05-27 | Disposition: A | Discharge status: Home / Self Care | Payer: Medicaid Other | Attending: Emergency Medicine | Admitting: Emergency Medicine

## 2015-05-26 ENCOUNTER — Encounter (HOSPITAL_BASED_OUTPATIENT_CLINIC_OR_DEPARTMENT_OTHER): Payer: Self-pay | Admitting: Emergency Medicine

## 2015-05-26 DIAGNOSIS — Z720 Tobacco use: Secondary | ICD-10-CM | POA: Insufficient documentation

## 2015-05-26 DIAGNOSIS — G40909 Epilepsy, unspecified, not intractable, without status epilepticus: Secondary | ICD-10-CM | POA: Insufficient documentation

## 2015-05-26 DIAGNOSIS — R51 Headache: Secondary | ICD-10-CM | POA: Insufficient documentation

## 2015-05-26 DIAGNOSIS — R519 Headache, unspecified: Secondary | ICD-10-CM

## 2015-05-26 DIAGNOSIS — R1110 Vomiting, unspecified: Secondary | ICD-10-CM | POA: Insufficient documentation

## 2015-05-26 DIAGNOSIS — Z79899 Other long term (current) drug therapy: Secondary | ICD-10-CM | POA: Insufficient documentation

## 2015-05-26 MED ORDER — DIPHENHYDRAMINE HCL 50 MG/ML IJ SOLN
25.0000 mg | Freq: Once | INTRAMUSCULAR | Status: AC
  Administered 2015-05-27: 25 mg via INTRAVENOUS
  Filled 2015-05-26: qty 1

## 2015-05-26 MED ORDER — KETOROLAC TROMETHAMINE 30 MG/ML IJ SOLN
30.0000 mg | Freq: Once | INTRAMUSCULAR | Status: AC
  Administered 2015-05-27: 30 mg via INTRAVENOUS
  Filled 2015-05-26: qty 1

## 2015-05-26 MED ORDER — SODIUM CHLORIDE 0.9 % IV BOLUS (SEPSIS)
1000.0000 mL | Freq: Once | INTRAVENOUS | Status: AC
  Administered 2015-05-27: 1000 mL via INTRAVENOUS

## 2015-05-26 MED ORDER — PROCHLORPERAZINE EDISYLATE 5 MG/ML IJ SOLN
10.0000 mg | Freq: Four times a day (QID) | INTRAMUSCULAR | Status: DC | PRN
  Administered 2015-05-27: 10 mg via INTRAVENOUS
  Filled 2015-05-26: qty 2

## 2015-05-26 MED ORDER — LEVETIRACETAM 750 MG PO TABS
750.0000 mg | ORAL_TABLET | Freq: Once | ORAL | Status: DC
Start: 2015-05-27 — End: 2015-05-27
  Filled 2015-05-26: qty 1

## 2015-05-26 NOTE — ED Notes (Signed)
Had seizure on Sunday,  Was given keppra  At [facility] , Has not gotten prescription filled since  Has had ha since sunday

## 2015-05-26 NOTE — ED Notes (Addendum)
Pt reports headache since Sunday morning after seizure mother reports found her postictal at that time headache has been on going since then. Seen at [facility] on Sunday and given Keppra at that time . Pt take Keppra routinely 750 mg BID but has been out of med and has no insurance / Medicaid in process of approval

## 2015-05-26 NOTE — ED Provider Notes (Signed)
CSN: [id number]     Arrival date & time 05/26/15  2324 History   This chart was scribed for Shon Baton, MD by Arlan Organ, ED Scribe. This patient was seen in room MH02/MH02 and the patient's care was started 11:49 PM.   Chief Complaint  Patient presents with  . Headache   The history is provided by the patient. No language interpreter was used.    HPI Comments: Jordan Blair is a 24 y.o. female with a PMHx of seizures who presents to the Emergency Department complaining of constant, ongoing HA x 3 days. Pain is described as sharp and rated 8/10. No previous history of migraines.  Pt states she experienced a seizure 3 days ago and was seen at [facility] for treatment. She was given Keppra at time of visit and discharged with a prescription for same. However, pt was not able to fill her medication due to insurance issues. OTC Tylenol attempted at home, but pt was not able to keep this down. No recent fever or chills. No known allergies to medications.  Past Medical History  Diagnosis Date  . Seizures    Past Surgical History  Procedure Laterality Date  . Induced abortion     History reviewed. No pertinent family history. Social History  Substance Use Topics  . Smoking status: Current Every Day Smoker  . Smokeless tobacco: None  . Alcohol Use: Yes     Comment: social   OB History    No data available     Review of Systems  Constitutional: Negative for fever and chills.  Respiratory: Negative for cough and shortness of breath.   Cardiovascular: Negative for chest pain.  Gastrointestinal: Positive for vomiting. Negative for nausea and abdominal pain.  Neurological: Positive for seizures and headaches.  Psychiatric/Behavioral: Negative for confusion.  All other systems reviewed and are negative.     Allergies  Review of patient's allergies indicates no known allergies.  Home Medications   Prior to Admission medications   Medication Sig Start Date End  Date Taking? Authorizing Provider  levETIRAcetam (KEPPRA) 500 MG tablet Take 500 mg by mouth 2 (two) times daily.    Historical Provider, MD  ondansetron (ZOFRAN ODT) 4 MG disintegrating tablet 4mg  ODT q4 hours prn nausea/vomit 01/31/15   Loren Racer, MD  oxyCODONE-acetaminophen (PERCOCET) 5-325 MG per tablet Take 1-2 tablets by mouth every 6 (six) hours as needed. 02/28/15   Geoffery Lyons, MD   Triage Vitals: BP 113/67 mmHg  Pulse 76  Temp(Src) 98.4 F (36.9 C) (Oral)  Resp 18  SpO2 100%  LMP 04/26/2015   Physical Exam  Constitutional: She is oriented to person, place, and time. She appears well-developed and well-nourished. No distress.  HENT:  Head: Normocephalic and atraumatic.  Eyes: EOM are normal. Pupils are equal, round, and reactive to light.  Neck: Normal range of motion. Neck supple.  No evidence of meningismus  Cardiovascular: Normal rate, regular rhythm and normal heart sounds.   Pulmonary/Chest: Effort normal. No respiratory distress. She has no wheezes.  Abdominal: Soft. Bowel sounds are normal.  Neurological: She is alert and oriented to person, place, and time.  Cranial nerves II through XII intact, no dysmetria to finger-nose-finger, 5 out of 5 strength in all 4 extremities  Skin: Skin is warm and dry.  Psychiatric: She has a normal mood and affect.  Nursing note and vitals reviewed.   ED Course  Procedures (including critical care time)  DIAGNOSTIC STUDIES: Oxygen Saturation is 100%  on RA, Normal by my interpretation.    COORDINATION OF CARE: 11:55 PM- Will give Toradol, Compazine, Benadryl, and Keppra. Discussed treatment plan with pt at bedside and pt agreed to plan.     Labs Review Labs Reviewed - No data to display  Imaging Review No results found. I have personally reviewed and evaluated these images and lab results as part of my medical decision-making.   EKG Interpretation None      MDM   Final diagnoses:  Acute nonintractable headache,  unspecified headache type    Patient presents with persistent headache since Sunday. She is currently unable to obtain her seizure medication. She has not had any recurrent seizures. No fever or meningismus suggestive of meningitis. Likely migraine.  She is neurologically intact. Patient given Keppra as well as migraine cocktail. On repeat exam, patient reports resolution of her headache. Her exam continues to be benign. Mother states that they are attempting to obtain her seizure medication as an outpatient. Patient given strict return precautions. Follow-up with primary physician.  After history, exam, and medical workup I feel the patient has been appropriately medically screened and is safe for discharge home. Pertinent diagnoses were discussed with the patient. Patient was given return precautions.   I personally performed the services described in this documentation, which was scribed in my presence. The recorded information has been reviewed and is accurate.   Shon Baton, MD 05/27/15 [phone]

## 2015-05-26 NOTE — ED Notes (Signed)
Seizure pads applied to bed.  

## 2015-05-27 MED ORDER — LEVETIRACETAM 500 MG PO TABS
1000.0000 mg | ORAL_TABLET | Freq: Once | ORAL | Status: AC
  Administered 2015-05-27: 1000 mg via ORAL
  Filled 2015-05-27: qty 2

## 2015-05-27 NOTE — Discharge Instructions (Signed)

## 2015-07-25 ENCOUNTER — Encounter (HOSPITAL_BASED_OUTPATIENT_CLINIC_OR_DEPARTMENT_OTHER): Payer: Self-pay | Admitting: Emergency Medicine

## 2015-07-25 ENCOUNTER — Emergency Department (HOSPITAL_BASED_OUTPATIENT_CLINIC_OR_DEPARTMENT_OTHER)
Admission: EM | Admit: 2015-07-25 | Discharge: 2015-07-25 | Disposition: A | Discharge status: Home / Self Care | Payer: Medicaid Other | Attending: Emergency Medicine | Admitting: Emergency Medicine

## 2015-07-25 DIAGNOSIS — G40909 Epilepsy, unspecified, not intractable, without status epilepticus: Secondary | ICD-10-CM | POA: Insufficient documentation

## 2015-07-25 DIAGNOSIS — A599 Trichomoniasis, unspecified: Secondary | ICD-10-CM

## 2015-07-25 DIAGNOSIS — B379 Candidiasis, unspecified: Secondary | ICD-10-CM

## 2015-07-25 DIAGNOSIS — Z79899 Other long term (current) drug therapy: Secondary | ICD-10-CM | POA: Insufficient documentation

## 2015-07-25 DIAGNOSIS — Z3202 Encounter for pregnancy test, result negative: Secondary | ICD-10-CM | POA: Insufficient documentation

## 2015-07-25 DIAGNOSIS — R112 Nausea with vomiting, unspecified: Secondary | ICD-10-CM | POA: Insufficient documentation

## 2015-07-25 DIAGNOSIS — R197 Diarrhea, unspecified: Secondary | ICD-10-CM | POA: Insufficient documentation

## 2015-07-25 DIAGNOSIS — Z720 Tobacco use: Secondary | ICD-10-CM | POA: Insufficient documentation

## 2015-07-25 LAB — WET PREP, GENITAL

## 2015-07-25 LAB — CBC WITH DIFFERENTIAL/PLATELET
Basophils Absolute: 0 10*3/uL (ref 0.0–0.1)
Basophils Relative: 0 %
EOS ABS: 0.1 10*3/uL (ref 0.0–0.7)
Eosinophils Relative: 1 %
HCT: 40.4 % (ref 36.0–46.0)
HEMOGLOBIN: 13.7 g/dL (ref 12.0–15.0)
LYMPHS ABS: 1.6 10*3/uL (ref 0.7–4.0)
LYMPHS PCT: 19 %
MCH: 29.8 pg (ref 26.0–34.0)
MCHC: 33.9 g/dL (ref 30.0–36.0)
MCV: 87.8 fL (ref 78.0–100.0)
MONOS PCT: 7 %
Monocytes Absolute: 0.6 10*3/uL (ref 0.1–1.0)
NEUTROS PCT: 73 %
Neutro Abs: 6.5 10*3/uL (ref 1.7–7.7)
Platelets: 266 10*3/uL (ref 150–400)
RBC: 4.6 MIL/uL (ref 3.87–5.11)
RDW: 14 % (ref 11.5–15.5)
WBC: 8.8 10*3/uL (ref 4.0–10.5)

## 2015-07-25 LAB — COMPREHENSIVE METABOLIC PANEL
ALT: 20 U/L (ref 14–54)
AST: 24 U/L (ref 15–41)
Albumin: 5 g/dL (ref 3.5–5.0)
Alkaline Phosphatase: 54 U/L (ref 38–126)
Anion gap: 9 (ref 5–15)
BUN: 11 mg/dL (ref 6–20)
CO2: 21 mmol/L — ABNORMAL LOW (ref 22–32)
CREATININE: 0.75 mg/dL (ref 0.44–1.00)
Calcium: 9.4 mg/dL (ref 8.9–10.3)
Chloride: 107 mmol/L (ref 101–111)
Glucose, Bld: 87 mg/dL (ref 65–99)
Potassium: 3.8 mmol/L (ref 3.5–5.1)
SODIUM: 137 mmol/L (ref 135–145)
Total Bilirubin: 0.7 mg/dL (ref 0.3–1.2)
Total Protein: 8.4 g/dL — ABNORMAL HIGH (ref 6.5–8.1)

## 2015-07-25 LAB — URINALYSIS, ROUTINE W REFLEX MICROSCOPIC
Bilirubin Urine: NEGATIVE
GLUCOSE, UA: NEGATIVE mg/dL
Hgb urine dipstick: NEGATIVE
KETONES UR: NEGATIVE mg/dL
Nitrite: NEGATIVE
Protein, ur: 30 mg/dL — AB
Specific Gravity, Urine: 1.029 (ref 1.005–1.030)
Urobilinogen, UA: 0.2 mg/dL (ref 0.0–1.0)
pH: 5 (ref 5.0–8.0)

## 2015-07-25 LAB — LIPASE, BLOOD: LIPASE: 16 U/L — AB (ref 22–51)

## 2015-07-25 LAB — URINE MICROSCOPIC-ADD ON

## 2015-07-25 LAB — PREGNANCY, URINE: Preg Test, Ur: NEGATIVE

## 2015-07-25 MED ORDER — ONDANSETRON HCL 4 MG/2ML IJ SOLN
4.0000 mg | Freq: Once | INTRAMUSCULAR | Status: AC
  Administered 2015-07-25: 4 mg via INTRAVENOUS
  Filled 2015-07-25: qty 2

## 2015-07-25 MED ORDER — SODIUM CHLORIDE 0.9 % IV BOLUS (SEPSIS)
1000.0000 mL | Freq: Once | INTRAVENOUS | Status: AC
  Administered 2015-07-25: 1000 mL via INTRAVENOUS

## 2015-07-25 MED ORDER — METRONIDAZOLE 500 MG PO TABS: 500.0000 mg | ORAL_TABLET | Freq: Two times a day (BID) | ORAL | Status: DC

## 2015-07-25 MED ORDER — AZITHROMYCIN 250 MG PO TABS
1000.0000 mg | ORAL_TABLET | Freq: Once | ORAL | Status: AC
  Administered 2015-07-25: 1000 mg via ORAL
  Filled 2015-07-25: qty 4

## 2015-07-25 MED ORDER — ONDANSETRON HCL 4 MG PO TABS: 4.0000 mg | ORAL_TABLET | Freq: Four times a day (QID) | ORAL | Status: DC | PRN

## 2015-07-25 MED ORDER — ACETAMINOPHEN 325 MG PO TABS
650.0000 mg | ORAL_TABLET | Freq: Once | ORAL | Status: AC
  Administered 2015-07-25: 650 mg via ORAL
  Filled 2015-07-25: qty 2

## 2015-07-25 MED ORDER — LIDOCAINE HCL (PF) 1 % IJ SOLN
INTRAMUSCULAR | Status: AC
  Administered 2015-07-25: 1 mL
  Filled 2015-07-25: qty 5

## 2015-07-25 MED ORDER — FLUCONAZOLE 150 MG PO TABS: 150.0000 mg | ORAL_TABLET | Freq: Every day | ORAL | Status: AC

## 2015-07-25 MED ORDER — CEFTRIAXONE SODIUM 250 MG IJ SOLR
250.0000 mg | Freq: Once | INTRAMUSCULAR | Status: AC
  Administered 2015-07-25: 250 mg via INTRAMUSCULAR
  Filled 2015-07-25: qty 250

## 2015-07-25 NOTE — ED Notes (Signed)
Patient c/o nausea/vomiting that started around 3am. Some loose stools

## 2015-07-25 NOTE — ED Provider Notes (Signed)
CSN: [id number]     Arrival date & time 07/25/15  0808 History   First MD Initiated Contact with Patient 07/25/15 [id number]     Chief Complaint  Patient presents with  . Emesis     (Consider location/radiation/quality/duration/timing/severity/associated sxs/prior Treatment) HPI 24 year old female who presents with nausea and vomiting. Has a history of a seizure disorder and no prior abdominal surgeries. Reports being in her usual state of health and having some generalized abdominal pain when she went to bed yesterday evening. She reports up in the middle of the night with intractable nausea and vomiting. One episode of loose stools. Has also had generalized musculoskeletal back pain in the setting as well. Denies fever, chills, melena, hematochezia, hematemesis, flank pain, dysuria, urinary frequency, hematuria, vaginal bleeding, or vaginal discharge. No known sick contacts, but daughter is currently sick with upper respiratory illness. Has had mild congestion but denies chest pain difficulty breathing or significant cough. Past Medical History  Diagnosis Date  . Seizures [facility])    Past Surgical History  Procedure Laterality Date  . Induced abortion     History reviewed. No pertinent family history. Social History  Substance Use Topics  . Smoking status: Current Every Day Smoker  . Smokeless tobacco: None  . Alcohol Use: Yes     Comment: social   OB History    No data available     Review of Systems  10/14 systems reviewed and are negative other than those stated in the HPI   Allergies  Review of patient's allergies indicates no known allergies.  Home Medications   Prior to Admission medications   Medication Sig Start Date End Date Taking? Authorizing Provider  fluconazole (DIFLUCAN) 150 MG tablet Take 1 tablet (150 mg total) by mouth daily. If persistent symptoms after 3 days, take a second dose. 07/25/15 08/01/15  Lavera Guise, MD  levETIRAcetam (KEPPRA) 500 MG tablet Take  500 mg by mouth 2 (two) times daily.    Historical Provider, MD  metroNIDAZOLE (FLAGYL) 500 MG tablet Take 1 tablet (500 mg total) by mouth 2 (two) times daily. 07/25/15   Lavera Guise, MD  ondansetron (ZOFRAN ODT) 4 MG disintegrating tablet  ODT q4 hours prn nausea/vomit 01/31/15   Loren Racer, MD  ondansetron (ZOFRAN) 4 MG tablet Take 1 tablet (4 mg total) by mouth every 6 (six) hours as needed for nausea or vomiting. 07/25/15   Lavera Guise, MD  oxyCODONE-acetaminophen (PERCOCET) 5-325 MG per tablet Take 1-2 tablets by mouth every 6 (six) hours as needed. 02/28/15   Geoffery Lyons, MD   BP 127/85 mmHg  Pulse 97  Temp(Src) 98.2 F (36.8 C) (Oral)  Resp 18  SpO2 100% Physical Exam Physical Exam  Nursing note and vitals reviewed. Constitutional: Well developed, well nourished, non-toxic, and in no acute distress Head: Normocephalic and atraumatic.  Mouth/Throat: Oropharynx is clear. Mucous membranes appear dry.  Neck: Normal range of motion. Neck supple.  Cardiovascular: Normal rate and regular rhythm.   Pulmonary/Chest: Effort normal and breath sounds normal.  Abdominal: Soft. No distension. Minimal suprapubic pain. There is no tenderness at McBurney's point. Negative murphy's sign. There is no rebound and no guarding. No CVA tenderness. Musculoskeletal: Normal range of motion.  Neurological: Alert, no facial droop, fluent speech, moves all extremities symmetrically Skin: Skin is warm and dry.  Psychiatric: Cooperative  Pelvic: Normal external genitalia. Normal internal genitalia. Copious white vaginal discharge. No blood within the vagina. No cervical motion tenderness. No adnexal masses or  tenderness.   ED Course  Procedures (including critical care time) Labs Review Labs Reviewed  WET PREP, GENITAL - Abnormal; Notable for the following:    Yeast Wet Prep HPF POC MODERATE (*)    Trich, Wet Prep MODERATE (*)    Clue Cells Wet Prep HPF POC FEW (*)    WBC, Wet Prep HPF POC  MANY (*)    All other components within normal limits  URINALYSIS, ROUTINE W REFLEX MICROSCOPIC (NOT AT [facility]) - Abnormal; Notable for the following:    APPearance CLOUDY (*)    Protein, ur 30 (*)    Leukocytes, UA SMALL (*)    All other components within normal limits  COMPREHENSIVE METABOLIC PANEL - Abnormal; Notable for the following:    CO2 21 (*)    Total Protein 8.4 (*)    All other components within normal limits  LIPASE, BLOOD - Abnormal; Notable for the following:    Lipase 16 (*)    All other components within normal limits  URINE MICROSCOPIC-ADD ON - Abnormal; Notable for the following:    Squamous Epithelial / LPF MANY (*)    Bacteria, UA MANY (*)    All other components within normal limits  PREGNANCY, URINE  CBC WITH DIFFERENTIAL/PLATELET  RPR  HIV ANTIBODY (ROUTINE TESTING)  GC/CHLAMYDIA PROBE AMP () NOT AT [facility]   I have personally reviewed and evaluated these images and lab results as part of my medical decision-making.   MDM   Final diagnoses:  Nausea vomiting and diarrhea  Trichomonas infection  Yeast infection    24 year old female who presents with nausea, vomiting, diarrhea with generalized abdominal pain. She is nontoxic and in no acute distress on presentation. Vital signs are overall non-concerning. She is a soft and benign abdomen. She does appear dry on exam. Overall, I was concerned at this time for serious or surgical etiologies of her abdominal pain at this time. Basic blood work including CBC, comprehensive metabolic profile, lipase are unremarkable. Evidence of Trichomonas on her urinalysis. Pelvic exam also revealed yeast and BV. Was also empirically treated for STDs with ceftriaxone and azithromycin. No evidence of cervical motion tenderness or low pelvic tenderness to raise suspicion for TOA or PID. Given a course of Flagyl for treatment of her BV and Trichomonas as well as his dose of Diflucan for her yeast infection. Supportively  treated with for likely benign GI illness, and given IV fluids and antibiotics. Has been able to tolerate oral intake here without difficulty. Symptomatically feels improved. Discharged with course of Zofran. Strict return and follow-up instructions are reviewed. She expressed understanding of all discharge instructions for comfortable to plan of care.        Lavera Guiseana Duo Josehua Hammar, MD 07/25/15 [phone]

## 2015-07-25 NOTE — ED Notes (Signed)
Pt offered oral fluids and food; tolerated well

## 2015-07-25 NOTE — Discharge Instructions (Signed)
You were treated for sexually transmitted illnesses today. Please refrain from sexual intercourse while you are taking this medication, and avoid intercourse a week after to make sure you're infection clears. If you do not sexual intercourse during this time, pre-disease use condoms or other barrier protection. Return without fail for worsening symptoms, including worsening pain, vomiting and not keeping down food or fluids, fever, or any other symptoms concerning to you.   Nausea and Vomiting Nausea means you feel sick to your stomach. Throwing up (vomiting) is a reflex where stomach contents come out of your mouth. HOME CARE   Take medicine as told by your doctor.  Do not force yourself to eat. However, you do need to drink fluids.  If you feel like eating, eat a normal diet as told by your doctor.  Eat rice, wheat, potatoes, bread, lean meats, yogurt, fruits, and vegetables.  Avoid high-fat foods.  Drink enough fluids to keep your pee (urine) clear or pale yellow.  Ask your doctor how to replace body fluid losses (rehydrate). Signs of body fluid loss (dehydration) include:  Feeling very thirsty.  Dry lips and mouth.  Feeling dizzy.  Dark pee.  Peeing less than normal.  Feeling confused.  Fast breathing or heart rate. GET HELP RIGHT AWAY IF:   You have blood in your throw up.  You have black or bloody poop (stool).  You have a bad headache or stiff neck.  You feel confused.  You have bad belly (abdominal) pain.  You have chest pain or trouble breathing.  You do not pee at least once every 8 hours.  You have cold, clammy skin.  You keep throwing up after 24 to 48 hours.  You have a fever. MAKE SURE YOU:   Understand these instructions.  Will watch your condition.  Will get help right away if you are not doing well or get worse.   This information is not intended to replace advice given to you by your health care provider. Make sure you discuss any  questions you have with your health care provider.   Document Released: 03/13/2008 Document Revised: 12/18/2011 Document Reviewed: 02/24/2011 Elsevier Interactive Patient Education 2016 [employer]  Diarrhea Diarrhea is watery poop (stool). It can make you feel weak, tired, thirsty, or give you a dry mouth (signs of dehydration). Watery poop is a sign of another problem, most often an infection. It often lasts 2-3 days. It can last longer if it is a sign of something serious. Take care of yourself as told by your doctor. HOME CARE   Drink 1 cup (8 ounces) of fluid each time you have watery poop.  Do not drink the following fluids:  Those that contain simple sugars (fructose, glucose, galactose, lactose, sucrose, maltose).  Sports drinks.  Fruit juices.  Whole milk products.  Sodas.  Drinks with caffeine (coffee, tea, soda) or alcohol.  Oral rehydration solution may be used if the doctor says it is okay. You may make your own solution. Follow this recipe:   - teaspoon table salt.   teaspoon baking soda.   teaspoon salt substitute containing potassium chloride.  1 tablespoons sugar.  1 liter (34 ounces) of water.  Avoid the following foods:  High fiber foods, such as raw fruits and vegetables.  Nuts, seeds, and whole grain breads and cereals.   Those that are sweetened with sugar alcohols (xylitol, sorbitol, mannitol).  Try eating the following foods:  Starchy foods, such as rice, toast, pasta, low-sugar cereal, oatmeal,  baked potatoes, crackers, and bagels.  Bananas.  Applesauce.  Eat probiotic-rich foods, such as yogurt and milk products that are fermented.  Wash your hands well after each time you have watery poop.  Only take medicine as told by your doctor.  Take a warm bath to help lessen burning or pain from having watery poop. GET HELP RIGHT AWAY IF:   You cannot drink fluids without throwing up (vomiting).  You keep throwing up.  You have  blood in your poop, or your poop looks black and tarry.  You do not pee (urinate) in 6-8 hours, or there is only a small amount of very dark pee.  You have belly (abdominal) pain that gets worse or stays in the same spot (localizes).  You are weak, dizzy, confused, or light-headed.  You have a very bad headache.  Your watery poop gets worse or does not get better.  You have a fever or lasting symptoms for more than 2-3 days.  You have a fever and your symptoms suddenly get worse. MAKE SURE YOU:   Understand these instructions.  Will watch your condition.  Will get help right away if you are not doing well or get worse.   This information is not intended to replace advice given to you by your health care provider. Make sure you discuss any questions you have with your health care provider.   Document Released: 03/13/2008 Document Revised: 10/16/2014 Document Reviewed: 06/02/2012 Elsevier Interactive Patient Education [employer]

## 2015-07-26 LAB — GC/CHLAMYDIA PROBE AMP (~~LOC~~) NOT AT ARMC
CHLAMYDIA, DNA PROBE: NEGATIVE
NEISSERIA GONORRHEA: NEGATIVE

## 2015-07-27 LAB — SYPHILIS: RPR W/REFLEX TO RPR TITER AND TREPONEMAL ANTIBODIES, TRADITIONAL SCREENING AND DIAGNOSIS ALGORITHM: RPR Ser Ql: NONREACTIVE

## 2015-07-27 LAB — HIV ANTIBODY (ROUTINE TESTING W REFLEX): HIV SCREEN 4TH GENERATION: NONREACTIVE

## 2015-07-31 ENCOUNTER — Emergency Department (HOSPITAL_BASED_OUTPATIENT_CLINIC_OR_DEPARTMENT_OTHER)
Admission: EM | Admit: 2015-07-31 | Discharge: 2015-07-31 | Disposition: A | Discharge status: Home / Self Care | Payer: Medicaid Other | Attending: Emergency Medicine | Admitting: Emergency Medicine

## 2015-07-31 ENCOUNTER — Encounter (HOSPITAL_BASED_OUTPATIENT_CLINIC_OR_DEPARTMENT_OTHER): Payer: Self-pay | Admitting: *Deleted

## 2015-07-31 DIAGNOSIS — Z79899 Other long term (current) drug therapy: Secondary | ICD-10-CM | POA: Insufficient documentation

## 2015-07-31 DIAGNOSIS — Z3202 Encounter for pregnancy test, result negative: Secondary | ICD-10-CM | POA: Insufficient documentation

## 2015-07-31 DIAGNOSIS — Z720 Tobacco use: Secondary | ICD-10-CM | POA: Insufficient documentation

## 2015-07-31 DIAGNOSIS — G40909 Epilepsy, unspecified, not intractable, without status epilepticus: Secondary | ICD-10-CM | POA: Insufficient documentation

## 2015-07-31 DIAGNOSIS — K2970 Gastritis, unspecified, without bleeding: Secondary | ICD-10-CM

## 2015-07-31 LAB — CBC WITH DIFFERENTIAL/PLATELET
BASOS PCT: 0 %
Basophils Absolute: 0 10*3/uL (ref 0.0–0.1)
EOS ABS: 0 10*3/uL (ref 0.0–0.7)
Eosinophils Relative: 0 %
HCT: 44.3 % (ref 36.0–46.0)
Hemoglobin: 14.9 g/dL (ref 12.0–15.0)
LYMPHS ABS: 0.9 10*3/uL (ref 0.7–4.0)
Lymphocytes Relative: 10 %
MCH: 29.6 pg (ref 26.0–34.0)
MCHC: 33.6 g/dL (ref 30.0–36.0)
MCV: 88.1 fL (ref 78.0–100.0)
MONO ABS: 0.4 10*3/uL (ref 0.1–1.0)
MONOS PCT: 4 %
Neutro Abs: 7.8 10*3/uL — ABNORMAL HIGH (ref 1.7–7.7)
Neutrophils Relative %: 86 %
Platelets: 268 10*3/uL (ref 150–400)
RBC: 5.03 MIL/uL (ref 3.87–5.11)
RDW: 13.8 % (ref 11.5–15.5)
WBC: 9.1 10*3/uL (ref 4.0–10.5)

## 2015-07-31 LAB — HEPATIC FUNCTION PANEL
ALT: 15 U/L (ref 14–54)
AST: 28 U/L (ref 15–41)
Albumin: 5 g/dL (ref 3.5–5.0)
Alkaline Phosphatase: 61 U/L (ref 38–126)
Bilirubin, Direct: 0.3 mg/dL (ref 0.1–0.5)
Indirect Bilirubin: 0.8 mg/dL (ref 0.3–0.9)
Total Bilirubin: 1.1 mg/dL (ref 0.3–1.2)
Total Protein: 8.6 g/dL — ABNORMAL HIGH (ref 6.5–8.1)

## 2015-07-31 LAB — BASIC METABOLIC PANEL
Anion gap: 8 (ref 5–15)
BUN: 10 mg/dL (ref 6–20)
CALCIUM: 9.5 mg/dL (ref 8.9–10.3)
CO2: 26 mmol/L (ref 22–32)
CREATININE: 0.79 mg/dL (ref 0.44–1.00)
Chloride: 102 mmol/L (ref 101–111)
GFR calc non Af Amer: >60 mL/min (ref >60)
Glucose, Bld: 110 mg/dL — ABNORMAL HIGH (ref 65–99)
Potassium: 3.9 mmol/L (ref 3.5–5.1)
SODIUM: 136 mmol/L (ref 135–145)

## 2015-07-31 LAB — LIPASE, BLOOD: Lipase: 19 U/L (ref 11–51)

## 2015-07-31 LAB — URINALYSIS, ROUTINE W REFLEX MICROSCOPIC
Bilirubin Urine: NEGATIVE
Glucose, UA: NEGATIVE mg/dL
Hgb urine dipstick: NEGATIVE
Ketones, ur: 15 mg/dL — AB
Nitrite: NEGATIVE
Protein, ur: 30 mg/dL — AB
Specific Gravity, Urine: 1.034 — ABNORMAL HIGH (ref 1.005–1.030)
Urobilinogen, UA: 0.2 mg/dL (ref 0.0–1.0)
pH: 7 (ref 5.0–8.0)

## 2015-07-31 LAB — URINE MICROSCOPIC-ADD ON

## 2015-07-31 LAB — RAPID URINE DRUG SCREEN, HOSP PERFORMED
Amphetamines: NOT DETECTED
Barbiturates: NOT DETECTED
Benzodiazepines: NOT DETECTED
Cocaine: POSITIVE — AB
Opiates: NOT DETECTED
Tetrahydrocannabinol: POSITIVE — AB

## 2015-07-31 LAB — PREGNANCY, URINE: Preg Test, Ur: NEGATIVE

## 2015-07-31 MED ORDER — ONDANSETRON 4 MG PO TBDP
4.0000 mg | ORAL_TABLET | Freq: Once | ORAL | Status: AC
  Administered 2015-07-31: 4 mg via ORAL
  Filled 2015-07-31: qty 1

## 2015-07-31 MED ORDER — ONDANSETRON HCL 4 MG PO TABS: 4.0000 mg | ORAL_TABLET | Freq: Four times a day (QID) | ORAL | Status: DC | PRN

## 2015-07-31 NOTE — ED Notes (Signed)
Patient is stable and ambulatory.  Patient verbalizes understanding of discharge medications and instructions. 

## 2015-07-31 NOTE — ED Notes (Addendum)
Pt was seen at [facility] for pain in rt side chest pain, per pt's family wasn't seen for nausea or vomiting. Was given a muscle relaxant and rest of symptoms was due to a virus. No reports only c/o of nauseas and vomiting several times throughout the day.

## 2015-07-31 NOTE — Discharge Instructions (Signed)
Gastritis, Adult Gastritis is soreness and swelling (inflammation) of the lining of the stomach. Gastritis can develop as a sudden onset (acute) or long-term (chronic) condition. If gastritis is not treated, it can lead to stomach bleeding and ulcers. CAUSES  Gastritis occurs when the stomach lining is weak or damaged. Digestive juices from the stomach then inflame the weakened stomach lining. The stomach lining may be weak or damaged due to viral or bacterial infections. One common bacterial infection is the Helicobacter pylori infection. Gastritis can also result from excessive alcohol consumption, taking certain medicines, or having too much acid in the stomach.  SYMPTOMS  In some cases, there are no symptoms. When symptoms are present, they may include:  Pain or a burning sensation in the upper abdomen.  Nausea.  Vomiting.  An uncomfortable feeling of fullness after eating. DIAGNOSIS  Your caregiver may suspect you have gastritis based on your symptoms and a physical exam. To determine the cause of your gastritis, your caregiver may perform the following:  Blood or stool tests to check for the H pylori bacterium.  Gastroscopy. A thin, flexible tube (endoscope) is passed down the esophagus and into the stomach. The endoscope has a light and camera on the end. Your caregiver uses the endoscope to view the inside of the stomach.  Taking a tissue sample (biopsy) from the stomach to examine under a microscope. TREATMENT  Depending on the cause of your gastritis, medicines may be prescribed. If you have a bacterial infection, such as an H pylori infection, antibiotics may be given. If your gastritis is caused by too much acid in the stomach, H2 blockers or antacids may be given. Your caregiver may recommend that you stop taking aspirin, ibuprofen, or other nonsteroidal anti-inflammatory drugs (NSAIDs). HOME CARE INSTRUCTIONS  Only take over-the-counter or prescription medicines as directed by  your caregiver.  If you were given antibiotic medicines, take them as directed. Finish them even if you start to feel better.  Drink enough fluids to keep your urine clear or pale yellow.  Avoid foods and drinks that make your symptoms worse, such as:  Caffeine or alcoholic drinks.  Chocolate.  Peppermint or mint flavorings.  Garlic and onions.  Spicy foods.  Citrus fruits, such as oranges, lemons, or limes.  Tomato-based foods such as sauce, chili, salsa, and pizza.  Fried and fatty foods.  Eat small, frequent meals instead of large meals. SEEK IMMEDIATE MEDICAL CARE IF:   You have black or dark red stools.  You vomit blood or material that looks like coffee grounds.  You are unable to keep fluids down.  Your abdominal pain gets worse.  You have a fever.  You do not feel better after 1 week.  You have any other questions or concerns. MAKE SURE YOU:  Understand these instructions.  Will watch your condition.  Will get help right away if you are not doing well or get worse.   This information is not intended to replace advice given to you by your health care provider. Make sure you discuss any questions you have with your health care provider. See above for symptoms that would warrant return to the emergency department.   Follow-up with primary care provider if symptoms do not improve in 48 hours. Encourage clear liquid and bland food diet. Take Zofran for nausea as needed.

## 2015-07-31 NOTE — ED Notes (Signed)
Vomiting  X 12 hours

## 2015-08-02 NOTE — ED Provider Notes (Signed)
CSN: [id number]     Arrival date & time 07/31/15  2057 History   First MD Initiated Contact with Patient 07/31/15 2210     Chief Complaint  Patient presents with  . Vomiting     (Consider location/radiation/quality/duration/timing/severity/associated sxs/prior Treatment) HPI  Jordan Blair is a 24 y.o F with a pmhx of polysubstance abuse who presents the emergency department today complaining of nausea and vomiting that began this morning around 8 AM. Patient was seen approximately 3 hours ago at [facility] for a different complaint including right-sided chest pain. Patient was discharged home with muscle relaxant and tramadol. However now she is presenting to our facility with nausea and vomiting that have been going on since 8:00 this morning. Patient had 2 episodes of emesis that were nonbloody and nonbilious. Denies diarrhea, vomiting, abdominal pain, fever, dysuria. Past Medical History  Diagnosis Date  . Seizures [facility](HCC)    Past Surgical History  Procedure Laterality Date  . Induced abortion     No family history on file. Social History  Substance Use Topics  . Smoking status: Current Every Day Smoker  . Smokeless tobacco: None  . Alcohol Use: Yes     Comment: social   OB History    No data available     Review of Systems  All other systems reviewed and are negative.     Allergies  Review of patient's allergies indicates no known allergies.  Home Medications   Prior to Admission medications   Medication Sig Start Date End Date Taking? Authorizing Provider  levETIRAcetam (KEPPRA) 500 MG tablet Take 500 mg by mouth 2 (two) times daily.    Historical Provider, MD  metroNIDAZOLE (FLAGYL) 500 MG tablet Take 1 tablet (500 mg total) by mouth 2 (two) times daily. 07/25/15   Lavera Guiseana Duo Liu, MD  ondansetron (ZOFRAN ODT) 4 MG disintegrating tablet 4mg  ODT q4 hours prn nausea/vomit 01/31/15   Loren Raceravid Yelverton, MD  ondansetron (ZOFRAN) 4 MG tablet Take 1 tablet (4 mg  total) by mouth every 6 (six) hours as needed for nausea or vomiting. 07/31/15   Majour Frei Tripp Bartow Zylstra, PA-C  oxyCODONE-acetaminophen (PERCOCET) 5-325 MG per tablet Take 1-2 tablets by mouth every 6 (six) hours as needed. 02/28/15   Geoffery Lyonsouglas Delo, MD   BP 118/74 mmHg  Pulse 74  Temp(Src) 98.4 F (36.9 C) (Oral)  Resp 18  SpO2 99% Physical Exam  Constitutional: She is oriented to person, place, and time. She appears well-developed and well-nourished. No distress.  HENT:  Head: Normocephalic and atraumatic.  Mouth/Throat: No oropharyngeal exudate.  Eyes: Conjunctivae and EOM are normal. Pupils are equal, round, and reactive to light. Right eye exhibits no discharge. Left eye exhibits no discharge. No scleral icterus.  Neck: Normal range of motion. Neck supple.  Cardiovascular: Normal rate, regular rhythm, normal heart sounds and intact distal pulses.  Exam reveals no gallop and no friction rub.   No murmur heard. Pulmonary/Chest: Effort normal and breath sounds normal. No respiratory distress. She has no wheezes. She has no rales. She exhibits no tenderness.  Abdominal: Soft. Bowel sounds are normal. She exhibits no distension and no mass. There is no tenderness. There is no rebound and no guarding.  Musculoskeletal: Normal range of motion. She exhibits no edema.  Lymphadenopathy:    She has no cervical adenopathy.  Neurological: She is alert and oriented to person, place, and time. No cranial nerve deficit.  Strength 5/5 throughout. No sensory deficits.    Skin: Skin is  warm and dry. No rash noted. She is not diaphoretic. No erythema. No pallor.  Psychiatric: She has a normal mood and affect. Her behavior is normal.  Strange affect. Very lethargic.   Nursing note and vitals reviewed.   ED Course  Procedures (including critical care time) Labs Review Labs Reviewed  CBC WITH DIFFERENTIAL/PLATELET - Abnormal; Notable for the following:    Neutro Abs 7.8 (*)    All other components  within normal limits  BASIC METABOLIC PANEL - Abnormal; Notable for the following:    Glucose, Bld 110 (*)    All other components within normal limits  HEPATIC FUNCTION PANEL - Abnormal; Notable for the following:    Total Protein 8.6 (*)    All other components within normal limits  URINALYSIS, ROUTINE W REFLEX MICROSCOPIC (NOT AT [facility]) - Abnormal; Notable for the following:    Color, Urine AMBER (*)    APPearance CLOUDY (*)    Specific Gravity, Urine 1.034 (*)    Ketones, ur 15 (*)    Protein, ur 30 (*)    Leukocytes, UA TRACE (*)    All other components within normal limits  URINE RAPID DRUG SCREEN, HOSP PERFORMED - Abnormal; Notable for the following:    Cocaine POSITIVE (*)    Tetrahydrocannabinol POSITIVE (*)    All other components within normal limits  URINE MICROSCOPIC-ADD ON - Abnormal; Notable for the following:    Squamous Epithelial / LPF MANY (*)    Bacteria, UA MANY (*)    All other components within normal limits  LIPASE, BLOOD  PREGNANCY, URINE    Imaging Review No results found. I have personally reviewed and evaluated these images and lab results as part of my medical decision-making.   EKG Interpretation None      MDM   Final diagnoses:  Gastritis   24 year old female with past medical history of polysubstance abuse presents for nausea and vomiting that began this morning around 8 AM. Patient seen 3 hours ago at [facility] for separate chief complaint and was discharged home. Patient reports 2 episodes of emesis today that were nonbloody and nonbilious. No diarrhea. Abdominal exam benign. No vaginal discharge or bleeding. No dysuria.  UA negative for infection. UDS positive for cocaine and THC. All other labs within normal limits. Patient given fluids and Zofran. Patient able to tolerate by mouth in the emergency department. Pt in NAD. Suspect this is a gastritis. We'll DC home with Zofran. Patient given strict return precautions outlined in  patient discharge instructions. Vital signs stable.    Lester Kinsman [place], PA-C 08/02/15 [id number]  Vanetta Mulders, MD 08/06/15 [phone]

## 2015-12-13 ENCOUNTER — Emergency Department (HOSPITAL_BASED_OUTPATIENT_CLINIC_OR_DEPARTMENT_OTHER)
Admission: EM | Admit: 2015-12-13 | Discharge: 2015-12-13 | Disposition: A | Discharge status: Home / Self Care | Payer: Medicaid Other | Attending: Emergency Medicine | Admitting: Emergency Medicine

## 2015-12-13 ENCOUNTER — Encounter (HOSPITAL_BASED_OUTPATIENT_CLINIC_OR_DEPARTMENT_OTHER): Payer: Self-pay

## 2015-12-13 DIAGNOSIS — Z3202 Encounter for pregnancy test, result negative: Secondary | ICD-10-CM | POA: Insufficient documentation

## 2015-12-13 DIAGNOSIS — Z79899 Other long term (current) drug therapy: Secondary | ICD-10-CM | POA: Insufficient documentation

## 2015-12-13 DIAGNOSIS — Z792 Long term (current) use of antibiotics: Secondary | ICD-10-CM | POA: Insufficient documentation

## 2015-12-13 DIAGNOSIS — A084 Viral intestinal infection, unspecified: Secondary | ICD-10-CM

## 2015-12-13 DIAGNOSIS — F17200 Nicotine dependence, unspecified, uncomplicated: Secondary | ICD-10-CM | POA: Insufficient documentation

## 2015-12-13 LAB — URINALYSIS, ROUTINE W REFLEX MICROSCOPIC
Bilirubin Urine: NEGATIVE
GLUCOSE, UA: NEGATIVE mg/dL
HGB URINE DIPSTICK: NEGATIVE
KETONES UR: NEGATIVE mg/dL
LEUKOCYTES UA: NEGATIVE
Nitrite: NEGATIVE
PH: 6.5 (ref 5.0–8.0)
Protein, ur: NEGATIVE mg/dL
Specific Gravity, Urine: 1.02 (ref 1.005–1.030)

## 2015-12-13 LAB — COMPREHENSIVE METABOLIC PANEL
ALK PHOS: 48 U/L (ref 38–126)
ALT: 15 U/L (ref 14–54)
ANION GAP: 10 (ref 5–15)
AST: 22 U/L (ref 15–41)
Albumin: 4.4 g/dL (ref 3.5–5.0)
BILIRUBIN TOTAL: 0.8 mg/dL (ref 0.3–1.2)
BUN: 14 mg/dL (ref 6–20)
CALCIUM: 9.2 mg/dL (ref 8.9–10.3)
CO2: 22 mmol/L (ref 22–32)
Chloride: 104 mmol/L (ref 101–111)
Creatinine, Ser: 0.63 mg/dL (ref 0.44–1.00)
GFR calc non Af Amer: >60 mL/min (ref >60)
Glucose, Bld: 87 mg/dL (ref 65–99)
Potassium: 4.1 mmol/L (ref 3.5–5.1)
SODIUM: 136 mmol/L (ref 135–145)
TOTAL PROTEIN: 7.4 g/dL (ref 6.5–8.1)

## 2015-12-13 LAB — CBC WITH DIFFERENTIAL/PLATELET
Basophils Absolute: 0 10*3/uL (ref 0.0–0.1)
Basophils Relative: 0 %
EOS ABS: 0.3 10*3/uL (ref 0.0–0.7)
Eosinophils Relative: 3 %
HEMATOCRIT: 41.5 % (ref 36.0–46.0)
HEMOGLOBIN: 14.3 g/dL (ref 12.0–15.0)
LYMPHS ABS: 2.2 10*3/uL (ref 0.7–4.0)
Lymphocytes Relative: 24 %
MCH: 30.6 pg (ref 26.0–34.0)
MCHC: 34.5 g/dL (ref 30.0–36.0)
MCV: 88.7 fL (ref 78.0–100.0)
MONOS PCT: 9 %
Monocytes Absolute: 0.8 10*3/uL (ref 0.1–1.0)
NEUTROS PCT: 64 %
Neutro Abs: 6.2 10*3/uL (ref 1.7–7.7)
Platelets: 274 10*3/uL (ref 150–400)
RBC: 4.68 MIL/uL (ref 3.87–5.11)
RDW: 13.2 % (ref 11.5–15.5)
WBC: 9.5 10*3/uL (ref 4.0–10.5)

## 2015-12-13 LAB — LIPASE, BLOOD: Lipase: 15 U/L (ref 11–51)

## 2015-12-13 LAB — PREGNANCY, URINE: Preg Test, Ur: NEGATIVE

## 2015-12-13 MED ORDER — ONDANSETRON HCL 4 MG PO TABS: 4.0000 mg | ORAL_TABLET | Freq: Four times a day (QID) | ORAL | Status: DC

## 2015-12-13 MED ORDER — SODIUM CHLORIDE 0.9 % IV BOLUS (SEPSIS)
1000.0000 mL | Freq: Once | INTRAVENOUS | Status: AC
  Administered 2015-12-13: 1000 mL via INTRAVENOUS

## 2015-12-13 MED ORDER — ONDANSETRON HCL 4 MG/2ML IJ SOLN
4.0000 mg | Freq: Once | INTRAMUSCULAR | Status: AC
  Administered 2015-12-13: 4 mg via INTRAVENOUS
  Filled 2015-12-13: qty 2

## 2015-12-13 NOTE — ED Provider Notes (Signed)
CSN: [id number]     Arrival date & time 12/13/15  1133 History   First MD Initiated Contact with Patient 12/13/15 1235     Chief Complaint  Patient presents with  . Abdominal Pain   HPI  Jordan Blair is a 25 year old female presenting with nausea, vomiting, diarrhea and abdominal pain. Onset of symptoms was 3 days ago. She states that her nausea and vomiting are so severe that she has been unable to keep any foods or liquids down. She is complaining of generalized abdominal pain with is slightly worse in the left mid abdominen. Described as aching and cramping. She states that the pain worsens with vomiting and before bowel movements.The pain is somewhat alleviated after she vomits or has a bowel movement. Denies blood in her stool or vomit. She states her mother was recently sick with flu with similar symptoms of nausea, vomiting and diarrhea. She denies suspicious food intake before onset of symptoms. Denies fevers, chills, headache, dizziness, syncope, URI symptoms, dysuria, hematuria, flank pain, pelvic pain or vaginal discharge.   Past Medical History  Diagnosis Date  . Seizures [facility])    Past Surgical History  Procedure Laterality Date  . Induced abortion     No family history on file. Social History  Substance Use Topics  . Smoking status: Current Every Day Smoker -- 0.25 packs/day  . Smokeless tobacco: None  . Alcohol Use: No     Comment: social   OB History    No data available     Review of Systems  Gastrointestinal: Positive for nausea, vomiting, abdominal pain and diarrhea.  All other systems reviewed and are negative.     Allergies  Review of patient's allergies indicates no known allergies.  Home Medications   Prior to Admission medications   Medication Sig Start Date End Date Taking? Authorizing Provider  levETIRAcetam (KEPPRA) 500 MG tablet Take 500 mg by mouth 2 (two) times daily.    Historical Provider, MD  metroNIDAZOLE (FLAGYL) 500 MG tablet Take 1 tablet  (500 mg total) by mouth 2 (two) times daily. 07/25/15   Lavera Guise, MD  ondansetron (ZOFRAN ODT) 4 MG disintegrating tablet  ODT q4 hours prn nausea/vomit 01/31/15   Loren Racer, MD  ondansetron (ZOFRAN) 4 MG tablet Take 1 tablet (4 mg total) by mouth every 6 (six) hours as needed for nausea or vomiting. 07/31/15   Samantha Tripp Dowless, PA-C  ondansetron (ZOFRAN) 4 MG tablet Take 1 tablet (4 mg total) by mouth every 6 (six) hours. 12/13/15   Elisabel Hanover, PA-C  oxyCODONE-acetaminophen (PERCOCET) 5-325 MG per tablet Take 1-2 tablets by mouth every 6 (six) hours as needed. 02/28/15   Geoffery Lyons, MD   BP 115/70 mmHg  Pulse 77  Temp(Src) 98.1 F (36.7 C) (Oral)  Resp 16  Ht  (1.575 m)  Wt 61.236 kg  BMI 24.69 kg/m2  SpO2 99% Physical Exam  Constitutional: She appears well-developed and well-nourished. No distress.  Nontoxic appearing  HENT:  Head: Normocephalic and atraumatic.  Eyes: Conjunctivae are normal. Right eye exhibits no discharge. Left eye exhibits no discharge. No scleral icterus.  Neck: Normal range of motion.  Cardiovascular: Normal rate, regular rhythm and normal heart sounds.   Pulmonary/Chest: Effort normal and breath sounds normal. No respiratory distress.  Abdominal: Soft. Normal appearance. There is generalized tenderness. There is no rigidity, no rebound, no guarding and no CVA tenderness.  Mild generalized TTP. No guarding or rebound. No focal points of tenderness. No  CVA tenderness.   Musculoskeletal: Normal range of motion.  Neurological: She is alert. Coordination normal.  Skin: Skin is warm and dry.  Psychiatric: She has a normal mood and affect. Her behavior is normal.  Nursing note and vitals reviewed.   ED Course  Procedures (including critical care time) Labs Review Labs Reviewed  URINALYSIS, ROUTINE W REFLEX MICROSCOPIC (NOT AT [facility])  PREGNANCY, URINE  CBC WITH DIFFERENTIAL/PLATELET  COMPREHENSIVE METABOLIC PANEL  LIPASE, BLOOD     Imaging Review No results found. I have personally reviewed and evaluated these images and lab results as part of my medical decision-making.   EKG Interpretation None     3:45 - Re-evaluated patient. Reports full resolution of symptoms and states she is hungry. Asking for drink. Will PO challenge. Lab work unremarkable and presentation consistent with gastroenteritis.   MDM   Final diagnoses:  Viral gastroenteritis   Patient presenting with abdominal pain, nausea, vomiting and diarrhea. Symptoms consistent with viral gastroenteritis. Fluid bolus and zofran given in ED. Vitals are stable, patient is afebrile and appears in no acute distress. Mucus membranes are moist. Abdomen is soft with mild generalized tenderness. No focal tenderness and no peritoneal signs. Discussed that there is no indication for imaging at this time. Blood work reviewed and unremarkable. Patient is tolerating PO liquids before discharge. Will discharge home with supportive therapy. Strict return precautions discussed with patient at bedside and given in discharge paperwork. Patient is stable for discharge.      Alveta HeimlichStevi Natina Wiginton, PA-C 12/13/15 1626  Glynn OctaveStephen Rancour, MD 12/13/15 [phone]

## 2015-12-13 NOTE — Discharge Instructions (Signed)

## 2015-12-13 NOTE — ED Notes (Signed)
Pt reports 3 days of abd pain, n/v/d.  Denies sick contacts.

## 2016-07-22 ENCOUNTER — Encounter (HOSPITAL_BASED_OUTPATIENT_CLINIC_OR_DEPARTMENT_OTHER): Payer: Self-pay | Admitting: Emergency Medicine

## 2016-07-22 ENCOUNTER — Emergency Department (HOSPITAL_BASED_OUTPATIENT_CLINIC_OR_DEPARTMENT_OTHER)
Admission: EM | Admit: 2016-07-22 | Discharge: 2016-07-22 | Disposition: A | Discharge status: Home / Self Care | Payer: Medicaid Other | Attending: Emergency Medicine | Admitting: Emergency Medicine

## 2016-07-22 DIAGNOSIS — W010XXA Fall on same level from slipping, tripping and stumbling without subsequent striking against object, initial encounter: Secondary | ICD-10-CM | POA: Insufficient documentation

## 2016-07-22 DIAGNOSIS — Y939 Activity, unspecified: Secondary | ICD-10-CM | POA: Insufficient documentation

## 2016-07-22 DIAGNOSIS — Y999 Unspecified external cause status: Secondary | ICD-10-CM | POA: Insufficient documentation

## 2016-07-22 DIAGNOSIS — S0502XA Injury of conjunctiva and corneal abrasion without foreign body, left eye, initial encounter: Secondary | ICD-10-CM | POA: Insufficient documentation

## 2016-07-22 DIAGNOSIS — F17200 Nicotine dependence, unspecified, uncomplicated: Secondary | ICD-10-CM | POA: Insufficient documentation

## 2016-07-22 DIAGNOSIS — Y929 Unspecified place or not applicable: Secondary | ICD-10-CM | POA: Insufficient documentation

## 2016-07-22 DIAGNOSIS — Z79899 Other long term (current) drug therapy: Secondary | ICD-10-CM | POA: Insufficient documentation

## 2016-07-22 MED ORDER — TETRACAINE HCL 0.5 % OP SOLN
OPHTHALMIC | Status: AC
  Administered 2016-07-22: 2 [drp] via OPHTHALMIC
  Filled 2016-07-22: qty 4

## 2016-07-22 MED ORDER — ERYTHROMYCIN 5 MG/GM OP OINT: TOPICAL_OINTMENT | OPHTHALMIC | 0 refills | Status: DC

## 2016-07-22 MED ORDER — IBUPROFEN 800 MG PO TABS
800.0000 mg | ORAL_TABLET | Freq: Once | ORAL | Status: AC
  Administered 2016-07-22: 800 mg via ORAL
  Filled 2016-07-22: qty 1

## 2016-07-22 MED ORDER — TETRACAINE HCL 0.5 % OP SOLN
1.0000 [drp] | Freq: Once | OPHTHALMIC | Status: AC
  Administered 2016-07-22: 2 [drp] via OPHTHALMIC

## 2016-07-22 MED ORDER — FLUORESCEIN SODIUM 1 MG OP STRP
ORAL_STRIP | OPHTHALMIC | Status: AC
  Administered 2016-07-22: 1 via OPHTHALMIC
  Filled 2016-07-22: qty 1

## 2016-07-22 MED ORDER — FLUORESCEIN SODIUM 1 MG OP STRP
1.0000 | ORAL_STRIP | Freq: Once | OPHTHALMIC | Status: AC
  Administered 2016-07-22: 1 via OPHTHALMIC

## 2016-07-22 NOTE — ED Triage Notes (Signed)
Pt states she had a seizure last week, during which her contact she believes slipped back behind her eye and has been stuck since.  Pt states it was not painful until this morning when it awoke her from sleep.

## 2016-07-22 NOTE — Discharge Instructions (Signed)
It was my pleasure taking care of you today! Apply antibiotic ointment to the eye as directed.  If symptoms are not improved on Monday morning, you need to follow-up with an eye doctor. You can follow-up at the eye doctor listed or one of your choice. Return to ER for new or worsening symptoms, any additional concerns.

## 2016-07-22 NOTE — ED Provider Notes (Signed)
[facility] Provider Note   CSN: [id number] Arrival date & time: 07/22/16  0954     History   Chief Complaint Chief Complaint  Patient presents with  . Eye Injury    HPI Jordan Blair is a 25 y.o. female.  The history is provided by the patient and medical records. No language interpreter was used.  Eye Injury  Pertinent negatives include no chest pain, no abdominal pain, no headaches and no shortness of breath.   Jordan Blair is a 25 y.o. female  with a PMH of seizures who presents to the Emergency Department complaining of throbbing left eye pain that began this morning. Patient states that she had a seizure 8 days ago and when she returned to her baseline mental status, her left contact was not in place. She had no pain or eye symptoms at that time. She did not put a new contact in as they were for for cosmetic purposes only. She had no symptoms until this morning when her eye began hurting. No redness or discharge. No cough, congestion or fever.   Past Medical History:  Diagnosis Date  . Seizures (HCC)     There are no active problems to display for this patient.   Past Surgical History:  Procedure Laterality Date  . INDUCED ABORTION      OB History    No data available       Home Medications    Prior to Admission medications   Medication Sig Start Date End Date Taking? Authorizing Provider  levETIRAcetam (KEPPRA) 500 MG tablet Take 500 mg by mouth 2 (two) times daily.   Yes Historical Provider, MD  erythromycin ophthalmic ointment Place a 1/2 inch ribbon of ointment into the lower eyelid four times daily. 07/22/16   Joliene Salvador Pilcher Usbaldo Pannone, PA-C  metroNIDAZOLE (FLAGYL) 500 MG tablet Take 1 tablet (500 mg total) by mouth 2 (two) times daily. 07/25/15   Lavera Guiseana Duo Liu, MD  ondansetron (ZOFRAN ODT) 4 MG disintegrating tablet 4mg  ODT q4 hours prn nausea/vomit 01/31/15   Loren Raceravid Yelverton, MD  ondansetron (ZOFRAN) 4 MG tablet Take 1 tablet (4 mg total) by  mouth every 6 (six) hours as needed for nausea or vomiting. 07/31/15   Samantha Tripp Dowless, PA-C  ondansetron (ZOFRAN) 4 MG tablet Take 1 tablet (4 mg total) by mouth every 6 (six) hours. 12/13/15   Stevi Barrett, PA-C  oxyCODONE-acetaminophen (PERCOCET) 5-325 MG per tablet Take 1-2 tablets by mouth every 6 (six) hours as needed. 02/28/15   Geoffery Lyonsouglas Delo, MD    Family History No family history on file.  Social History Social History  Substance Use Topics  . Smoking status: Current Every Day Smoker    Packs/day: 0.25  . Smokeless tobacco: Never Used  . Alcohol use No     Comment: social     Allergies   Review of patient's allergies indicates no known allergies.   Review of Systems Review of Systems  Constitutional: Negative for fever.  HENT: Negative for congestion.   Eyes: Positive for pain. Negative for photophobia, discharge and redness.  Respiratory: Negative for shortness of breath.   Cardiovascular: Negative for chest pain.  Gastrointestinal: Negative for abdominal pain.  Genitourinary: Negative for dysuria.  Musculoskeletal: Negative for neck pain.  Skin: Negative for color change and rash.  Neurological: Negative for headaches.     Physical Exam Updated Vital Signs BP 136/69 (BP Location: Right Arm)   Pulse 88   Temp 98.6 F (37 C) (Oral)  Resp 20   Ht 5\' 2"  (1.575 m)   Wt 72.6 kg   LMP 07/17/2016 (Approximate)   SpO2 100%   BMI 29.26 kg/m   Physical Exam  Constitutional: She is oriented to person, place, and time. She appears well-developed and well-nourished. No distress.  HENT:  Head: Normocephalic and atraumatic.  Mouth/Throat: Oropharynx is clear and moist.  Eyes: Conjunctivae, EOM and lids are normal. Pupils are equal, round, and reactive to light. Lids are everted and swept, no foreign bodies found. Right eye exhibits no discharge. Left eye exhibits no discharge.  Slit lamp exam:      The left eye shows fluorescein uptake.  Cardiovascular:  Normal rate, regular rhythm and normal heart sounds.   No murmur heard. Pulmonary/Chest: Effort normal and breath sounds normal. No respiratory distress.  Abdominal: Soft. She exhibits no distension. There is no tenderness.  Neurological: She is alert and oriented to person, place, and time.  Skin: Skin is warm and dry.  Nursing note and vitals reviewed.    ED Treatments / Results  Labs (all labs ordered are listed, but only abnormal results are displayed) Labs Reviewed - No data to display  EKG  EKG Interpretation None       Radiology No results found.  Procedures Procedures (including critical care time)  Medications Ordered in ED Medications  ibuprofen (ADVIL,MOTRIN) tablet 800 mg (800 mg Oral Given 07/22/16 1053)  tetracaine (PONTOCAINE) 0.5 % ophthalmic solution 1-2 drop (2 drops Left Eye Given 07/22/16 1053)  fluorescein ophthalmic strip 1 strip (1 strip Left Eye Given 07/22/16 1054)     Initial Impression / Assessment and Plan / ED Course  I have reviewed the triage vital signs and the nursing notes.  Pertinent labs & imaging results that were available during my care of the patient were reviewed by me and considered in my medical decision making (see chart for details).  Clinical Course   Jordan Blair is a 25 y.o. female who presents to ED for left eye pain. She lost a contact after a seizure 8 days ago and believes her contact stuck in the back of her eye although symptoms began today. Eye flushed with mild relief. Eyelid everted and swept with no signs of foreign body. She does have fluorescein uptake consistent with a corneal abrasion of the left eye. Exam not concerning for orbital cellulitis, hyphema. Patient will be discharged home with erythromycin ointment.   Patient understands to follow up with ophthalmology in referral information given.Return precautions discussed and all questions answered.  Patient discussed with Dr. Deretha Emory who agrees with  treatment plan.   Final Clinical Impressions(s) / ED Diagnoses   Final diagnoses:  Abrasion of left cornea, initial encounter    New Prescriptions New Prescriptions   ERYTHROMYCIN OPHTHALMIC OINTMENT    Place a 1/2 inch ribbon of ointment into the lower eyelid four times daily.     [facility] Davinity Fanara, PA-C 07/22/16 1115    Vanetta Mulders, MD 07/22/16 1122

## 2016-10-03 ENCOUNTER — Encounter (HOSPITAL_BASED_OUTPATIENT_CLINIC_OR_DEPARTMENT_OTHER): Payer: Self-pay | Admitting: *Deleted

## 2016-10-03 ENCOUNTER — Emergency Department (HOSPITAL_BASED_OUTPATIENT_CLINIC_OR_DEPARTMENT_OTHER)
Admission: EM | Admit: 2016-10-03 | Discharge: 2016-10-03 | Disposition: A | Discharge status: Home / Self Care | Payer: Medicaid Other | Attending: Emergency Medicine | Admitting: Emergency Medicine

## 2016-10-03 DIAGNOSIS — N939 Abnormal uterine and vaginal bleeding, unspecified: Secondary | ICD-10-CM

## 2016-10-03 DIAGNOSIS — F17200 Nicotine dependence, unspecified, uncomplicated: Secondary | ICD-10-CM | POA: Insufficient documentation

## 2016-10-03 DIAGNOSIS — A599 Trichomoniasis, unspecified: Secondary | ICD-10-CM | POA: Insufficient documentation

## 2016-10-03 DIAGNOSIS — Z79899 Other long term (current) drug therapy: Secondary | ICD-10-CM | POA: Insufficient documentation

## 2016-10-03 LAB — URINALYSIS, MICROSCOPIC (REFLEX)

## 2016-10-03 LAB — URINALYSIS, ROUTINE W REFLEX MICROSCOPIC
Bilirubin Urine: NEGATIVE
Glucose, UA: NEGATIVE mg/dL
Ketones, ur: NEGATIVE mg/dL
NITRITE: NEGATIVE
PROTEIN: NEGATIVE mg/dL
Specific Gravity, Urine: 1.022 (ref 1.005–1.030)
pH: 5.5 (ref 5.0–8.0)

## 2016-10-03 LAB — WET PREP, GENITAL
Sperm: NONE SEEN
Yeast Wet Prep HPF POC: NONE SEEN

## 2016-10-03 LAB — PREGNANCY, URINE: PREG TEST UR: NEGATIVE

## 2016-10-03 MED ORDER — METRONIDAZOLE 500 MG PO TABS
2000.0000 mg | ORAL_TABLET | Freq: Once | ORAL | Status: AC
  Administered 2016-10-03: 2000 mg via ORAL
  Filled 2016-10-03: qty 4

## 2016-10-03 MED ORDER — AZITHROMYCIN 250 MG PO TABS
1000.0000 mg | ORAL_TABLET | Freq: Once | ORAL | Status: AC
  Administered 2016-10-03: 1000 mg via ORAL
  Filled 2016-10-03: qty 4

## 2016-10-03 MED ORDER — DOXYCYCLINE HYCLATE 100 MG PO CAPS: 100.0000 mg | ORAL_CAPSULE | Freq: Two times a day (BID) | ORAL | 0 refills | Status: DC

## 2016-10-03 MED ORDER — DOXYCYCLINE HYCLATE 100 MG PO TABS
100.0000 mg | ORAL_TABLET | Freq: Once | ORAL | Status: AC
  Administered 2016-10-03: 100 mg via ORAL
  Filled 2016-10-03: qty 1

## 2016-10-03 MED ORDER — CEFTRIAXONE SODIUM 250 MG IJ SOLR
250.0000 mg | Freq: Once | INTRAMUSCULAR | Status: AC
  Administered 2016-10-03: 250 mg via INTRAMUSCULAR
  Filled 2016-10-03: qty 250

## 2016-10-03 NOTE — ED Provider Notes (Signed)
[facility]   CSN: [id number] Arrival date & time: 10/03/16  1303     History   Chief Complaint Chief Complaint  Patient presents with  . Vaginal Bleeding    HPI Jordan Blair is a 25 y.o. female.  The history is provided by the patient.  Vaginal Bleeding  Primary symptoms include vaginal bleeding.  Primary symptoms include no discharge. There has been no fever. This is a new problem. The problem occurs constantly. The problem has not changed since onset.LMP: before today was 09/02/16. The patient's menstrual history has been irregular. Associated symptoms include abdominal pain (lower cramping, pain in back). She has tried nothing for the symptoms. She uses nothing for contraception.    Past Medical History:  Diagnosis Date  . Seizures (HCC)     There are no active problems to display for this patient.   Past Surgical History:  Procedure Laterality Date  . INDUCED ABORTION      OB History    No data available       Home Medications    Prior to Admission medications   Medication Sig Start Date End Date Taking? Authorizing Provider  levETIRAcetam (KEPPRA) 500 MG tablet Take 500 mg by mouth 2 (two) times daily.    Historical Provider, MD  ondansetron (ZOFRAN ODT) 4 MG disintegrating tablet 4mg  ODT q4 hours prn nausea/vomit 01/31/15   Loren Raceravid Yelverton, MD  ondansetron (ZOFRAN) 4 MG tablet Take 1 tablet (4 mg total) by mouth every 6 (six) hours. 12/13/15   Rolm GalaStevi Barrett, PA-C    Family History History reviewed. No pertinent family history.  Social History Social History  Substance Use Topics  . Smoking status: Current Every Day Smoker    Packs/day: 0.50  . Smokeless tobacco: Never Used  . Alcohol use No     Comment: social     Allergies   Patient has no known allergies.   Review of Systems Review of Systems  Gastrointestinal: Positive for abdominal pain (lower cramping, pain in back).  Genitourinary: Positive for vaginal  bleeding.  All other systems reviewed and are negative.    Physical Exam Updated Vital Signs BP 126/77   Pulse 98   Temp 97.5 F (36.4 C)   Resp 18   Ht 5\' 2"  (1.575 m)   Wt 150 lb (68 kg)   LMP 08/09/2016   SpO2 100%   BMI 27.44 kg/m   Physical Exam  Constitutional: She is oriented to person, place, and time. She appears well-developed and well-nourished. No distress.  HENT:  Head: Normocephalic.  Nose: Nose normal.  Eyes: Conjunctivae are normal.  Neck: Neck supple. No tracheal deviation present.  Cardiovascular: Normal rate and regular rhythm.   Pulmonary/Chest: Effort normal. No respiratory distress.  Abdominal: Soft. She exhibits no distension.  Genitourinary: Cervix exhibits no motion tenderness. Right adnexum displays no mass and no fullness. Left adnexum displays no mass and no fullness. There is bleeding (no pooling on spec exam) in the vagina. No vaginal discharge found.  Neurological: She is alert and oriented to person, place, and time.  Skin: Skin is warm and dry.  Psychiatric: She has a normal mood and affect.     ED Treatments / Results  Labs (all labs ordered are listed, but only abnormal results are displayed) Labs Reviewed  URINALYSIS, ROUTINE W REFLEX MICROSCOPIC - Abnormal; Notable for the following:       Result Value   APPearance CLOUDY (*)    Hgb urine dipstick  LARGE (*)    Leukocytes, UA SMALL (*)    All other components within normal limits  URINALYSIS, MICROSCOPIC (REFLEX) - Abnormal; Notable for the following:    Bacteria, UA FEW (*)    Squamous Epithelial / LPF 6-30 (*)    All other components within normal limits  PREGNANCY, URINE    EKG  EKG Interpretation None       Radiology No results found.  Procedures Procedures (including critical care time)  Medications Ordered in ED Medications - No data to display   Initial Impression / Assessment and Plan / ED Course  I have reviewed the triage vital signs and the nursing  notes.  Pertinent labs & imaging results that were available during my care of the patient were reviewed by me and considered in my medical decision making (see chart for details).  Clinical Course      25 y.o. female presents with Vaginal bleeding starting today. She is concerned she is pregnant because she took a pregnancy test last month and it came back positive. Her pregnancy test is negative today mainly this was likely a false positive or she has had a spontaneous miscarriage. No signs of retained products on bimanual and speculum exam. She is positive for Trichomonas so we will cover for PID, GC/chlamydia. Plan to follow up with PCP as needed and return precautions discussed for worsening or new concerning symptoms.   Final Clinical Impressions(s) / ED Diagnoses   Final diagnoses:  Vaginal bleeding  Trichomoniasis    New Prescriptions Discharge Medication List as of 10/03/2016  4:10 PM    START taking these medications   Details  doxycycline (VIBRAMYCIN) 100 MG capsule Take 1 capsule (100 mg total) by mouth 2 (two) times daily., Starting Wed 10/04/2016, Print         Lyndal Pulleyaniel Vallerie Hentz, MD 10/04/16 [phone]

## 2016-10-03 NOTE — ED Triage Notes (Signed)
Pt c/o vaginal bleeding with back pain and abd pain x 2 days pt is 1 month preg and has plans for elective abortin on friday

## 2016-10-04 LAB — GC/CHLAMYDIA PROBE AMP (~~LOC~~) NOT AT ARMC
Chlamydia: NEGATIVE
Neisseria Gonorrhea: NEGATIVE

## 2016-10-05 LAB — URINE CULTURE

## 2017-03-02 ENCOUNTER — Encounter (HOSPITAL_BASED_OUTPATIENT_CLINIC_OR_DEPARTMENT_OTHER): Payer: Self-pay

## 2017-03-02 ENCOUNTER — Emergency Department (HOSPITAL_BASED_OUTPATIENT_CLINIC_OR_DEPARTMENT_OTHER)
Admission: EM | Admit: 2017-03-02 | Discharge: 2017-03-02 | Disposition: A | Discharge status: Home / Self Care | Payer: Medicaid Other | Attending: Emergency Medicine | Admitting: Emergency Medicine

## 2017-03-02 ENCOUNTER — Emergency Department (HOSPITAL_BASED_OUTPATIENT_CLINIC_OR_DEPARTMENT_OTHER): Payer: Medicaid Other

## 2017-03-02 DIAGNOSIS — R519 Headache, unspecified: Secondary | ICD-10-CM

## 2017-03-02 DIAGNOSIS — Z79899 Other long term (current) drug therapy: Secondary | ICD-10-CM | POA: Insufficient documentation

## 2017-03-02 DIAGNOSIS — R51 Headache: Secondary | ICD-10-CM

## 2017-03-02 DIAGNOSIS — F17200 Nicotine dependence, unspecified, uncomplicated: Secondary | ICD-10-CM | POA: Insufficient documentation

## 2017-03-02 DIAGNOSIS — G44009 Cluster headache syndrome, unspecified, not intractable: Secondary | ICD-10-CM | POA: Insufficient documentation

## 2017-03-02 MED ORDER — IBUPROFEN 600 MG PO TABS: 600.0000 mg | ORAL_TABLET | Freq: Four times a day (QID) | ORAL | 0 refills | Status: DC | PRN

## 2017-03-02 MED ORDER — METOCLOPRAMIDE HCL 5 MG/ML IJ SOLN
10.0000 mg | Freq: Once | INTRAMUSCULAR | Status: AC
  Administered 2017-03-02: 10 mg via INTRAVENOUS
  Filled 2017-03-02: qty 2

## 2017-03-02 MED ORDER — SODIUM CHLORIDE 0.9 % IV BOLUS (SEPSIS)
1000.0000 mL | Freq: Once | INTRAVENOUS | Status: AC
  Administered 2017-03-02: 1000 mL via INTRAVENOUS

## 2017-03-02 MED ORDER — DIPHENHYDRAMINE HCL 50 MG/ML IJ SOLN
25.0000 mg | Freq: Once | INTRAMUSCULAR | Status: AC
  Administered 2017-03-02: 25 mg via INTRAVENOUS
  Filled 2017-03-02: qty 1

## 2017-03-02 NOTE — ED Notes (Signed)
Called x2. Pt not in waiting room.

## 2017-03-02 NOTE — ED Triage Notes (Signed)
C/o woke with HA to left temporal today-pt entered triage fast paced gait/crying

## 2017-03-02 NOTE — ED Notes (Signed)
Pt not in waiting room

## 2017-03-02 NOTE — ED Provider Notes (Signed)
[facility]   CSN: [id number] Arrival date & time: 03/02/17  1542  By signing my name below, I, Jordan Blair, attest that this documentation has been prepared under the direction and in the presence of Audry Piliyler Jaiveer Panas, PA-C. Electronically Signed: Cynda AcresHailei Blair, Scribe. 03/02/17. 7:30 PM.  History   Chief Complaint Chief Complaint  Patient presents with  . Headache    HPI Comments: Jordan Blair is a 26 y.o. female with a history of seizure disorder, who presents to the Emergency Department complaining of sudden-onset, constant temporal headache that began earlier today. Patient states her headache has gradually worsened throughout the day. Patient denies any syncopal episodes, injury, nausea, or vomiting. Patient reports associated photophobia and resolved blurred vision. No medications taken prior to arrival. Patient describes her pain as throbbing. Patient states her pain is mildly relieved when she applies pressure to the area. Patient denies any auditory sensitivity, chest pain, abdominal pain, numbness, or tingling.   The history is provided by the patient. No language interpreter was used.    Past Medical History:  Diagnosis Date  . Seizures (HCC)     There are no active problems to display for this patient.   Past Surgical History:  Procedure Laterality Date  . INDUCED ABORTION      OB History    No data available       Home Medications    Prior to Admission medications   Medication Sig Start Date End Date Taking? Authorizing Provider  levETIRAcetam (KEPPRA) 500 MG tablet Take 500 mg by mouth 2 (two) times daily.    [provider]    Family History No family history on file.  Social History Social History  Substance Use Topics  . Smoking status: Current Every Day Smoker    Packs/day: 0.50  . Smokeless tobacco: Never Used  . Alcohol use Yes     Comment: social     Allergies   Patient has no known  allergies.   Review of Systems Review of Systems  Constitutional: Negative for chills and fever.  Eyes: Positive for photophobia and visual disturbance (blurred).  Cardiovascular: Negative for chest pain.  Gastrointestinal: Negative for abdominal pain, nausea and vomiting.  Neurological: Positive for headaches. Negative for dizziness, weakness and numbness.     Physical Exam Updated Vital Signs BP 122/81 (BP Location: Right Arm)   Pulse 77   Temp 98.9 F (37.2 C) (Oral)   Resp 18   Ht 5\' 2"  (1.575 m)   Wt 140 lb (63.5 kg)   LMP 02/28/2017   SpO2 100%   BMI 25.61 kg/m   Physical Exam  Constitutional: She is oriented to person, place, and time. She appears well-developed and well-nourished.  HENT:  Head: Normocephalic and atraumatic.  Eyes: EOM are normal.  Neck: Normal range of motion.  Cardiovascular: Normal rate and regular rhythm.   Pulmonary/Chest: Effort normal and breath sounds normal.  Abdominal: Soft. She exhibits no distension. There is no tenderness.  Musculoskeletal: Normal range of motion.  Neurological: She is alert and oriented to person, place, and time. She has normal strength. She displays normal reflexes. No cranial nerve deficit or sensory deficit.  Cranial Nerves:  II: Pupils equal, round, reactive to light III,IV, VI: ptosis not present, extra-ocular motions intact bilaterally  V,VII: smile symmetric, facial light touch sensation equal VIII: hearing grossly normal bilaterally  IX,X: midline uvula rise  XI: bilateral shoulder shrug equal and strong XII: midline tongue extension  Skin: Skin  is warm and dry.  Psychiatric: She has a normal mood and affect.  Nursing note and vitals reviewed.    ED Treatments / Results  DIAGNOSTIC STUDIES: Oxygen Saturation is 100% on RA, normal by my interpretation.    COORDINATION OF CARE: 7:28 PM Discussed treatment plan with pt at bedside and pt agreed to plan, which includes IV fluids, migraine cocktail,  and a head CT.   Labs (all labs ordered are listed, but only abnormal results are displayed) Labs Reviewed - No data to display  EKG  EKG Interpretation None       Radiology No results found.  Procedures Procedures (including critical care time)  Medications Ordered in ED Medications - No data to display   Initial Impression / Assessment and Plan / ED Course  I have reviewed the triage vital signs and the nursing notes.  Pertinent labs & imaging results that were available during my care of the patient were reviewed by me and considered in my medical decision making (see chart for details).    Final Clinical Impressions(s) / ED Diagnoses    {I have reviewed and evaluated the relevant imaging studies.  {I have reviewed the relevant previous healthcare records.  {I obtained HPI from historian.   ED Course:  Assessment: Patient is a 26 y.o. female that presents with headache xtoday. Patient is without high-risk features of headache including: Altered mental status, Accompanying seizure, Headache with exertion, Age > 50, History of immunocompromise, Neck or shoulder pain, Fever, Use of anticoagulation, Family history of spontaneous SAH, Concomitant drug use, Toxic exposure.  Patient has a normal complete neurological exam, normal vital signs, normal level of consciousness, no signs of meningismus, is well-appearing/non-toxic appearing, no signs of trauma. No papilledema, no pain over the temporal arteries. Dye to no hx Migraines and sudden onset, CT ordered, which was negative.. No dangerous or life-threatening conditions suspected or identified by history, physical exam, and by work-up. No indications for hospitalization identified. Given migraine cocktail with complete relief of symptoms. At time of discharge, Patient is in no acute distress. Vital Signs are stable. Patient is able to ambulate. Patient able to tolerate PO.   Disposition/Plan:  DC Home Additional Verbal  discharge instructions given and discussed with patient.  Pt Instructed to f/u with PCP in the next week for evaluation and treatment of symptoms. Return precautions given Pt acknowledges and agrees with plan  Supervising Physician Loren Racer, MD  Final diagnoses:  Nonintractable headache, unspecified chronicity pattern, unspecified headache type    New Prescriptions New Prescriptions   No medications on file    I personally performed the services described in this documentation, which was scribed in my presence. The recorded information has been reviewed and is accurate.    Audry Pili, PA-C 03/02/17 1610    Loren Racer, MD 03/12/17 [phone]

## 2017-03-02 NOTE — ED Notes (Signed)
Pt is crying in the waiting room.  Awaiting open bed shortly

## 2017-03-02 NOTE — ED Notes (Signed)
Pt's mother came to desk and states that they had gone outside when called.  Pt continues to wait to be seen.

## 2017-03-02 NOTE — ED Notes (Signed)
Attempted to call patient to a room.  Pt had left department per another visitor in the waiting room

## 2017-03-02 NOTE — Discharge Instructions (Signed)
Please read and follow all provided instructions.  Your diagnoses today include:  1. Nonintractable headache, unspecified chronicity pattern, unspecified headache type     Tests performed today include: CT of your head which was normal and did not show any serious cause of your headache Vital signs. See below for your results today.   Medications:  In the Emergency Department you received: Reglan - antinausea/headache medication Benadryl - antihistamine to counteract potential side effects of reglan Toradol - NSAID medication similar to ibuprofen  Take any prescribed medications only as directed.  Additional information:  Follow any educational materials contained in this packet.  You are having a headache. No specific cause was found today for your headache. It may have been a migraine or other cause of headache. Stress, anxiety, fatigue, and depression are common triggers for headaches.   Your headache today does not appear to be life-threatening or require hospitalization, but often the exact cause of headaches is not determined in the emergency department. Therefore, follow-up with your doctor is very important to find out what may have caused your headache and whether or not you need any further diagnostic testing or treatment.   Sometimes headaches can appear benign (not harmful), but then more serious symptoms can develop which should prompt an immediate re-evaluation by your doctor or the emergency department.  BE VERY CAREFUL not to take multiple medicines containing Tylenol (also called acetaminophen). Doing so can lead to an overdose which can damage your liver and cause liver failure and possibly death.   Follow-up instructions: Please follow-up with your primary care provider in the next 3 days for further evaluation of your symptoms.   Return instructions:  Please return to the Emergency Department if you experience worsening symptoms. Return if the medications do not  resolve your headache, if it recurs, or if you have multiple episodes of vomiting or cannot keep down fluids. Return if you have a change from the usual headache. RETURN IMMEDIATELY IF you: Develop a sudden, severe headache Develop confusion or become poorly responsive or faint Develop a fever above 100.29F or problem breathing Have a change in speech, vision, swallowing, or understanding Develop new weakness, numbness, tingling, incoordination in your arms or legs Have a seizure Please return if you have any other emergent concerns.  Additional Information:  Your vital signs today were: BP 122/81 (BP Location: Right Arm)    Pulse 77    Temp 98.9 F (37.2 C) (Oral)    Resp 18    Ht 5\' 2"  (1.575 m)    Wt 63.5 kg (140 lb)    LMP 02/28/2017    SpO2 100%    BMI 25.61 kg/m  If your blood pressure (BP) was elevated above 135/85 this visit, please have this repeated by your doctor within one month. --------------

## 2017-03-02 NOTE — ED Notes (Signed)
Pt called x3. No answer in waiting room.

## 2017-03-02 NOTE — ED Notes (Signed)
Patient reports headache which began today.  Reports photophobia.  Denies nausea, vomiting, dizziness.

## 2019-01-11 ENCOUNTER — Other Ambulatory Visit: Payer: Self-pay

## 2019-01-11 ENCOUNTER — Emergency Department (HOSPITAL_BASED_OUTPATIENT_CLINIC_OR_DEPARTMENT_OTHER)
Admission: EM | Admit: 2019-01-11 | Discharge: 2019-01-11 | Disposition: A | Discharge status: Home / Self Care | Payer: Self-pay | Attending: Emergency Medicine | Admitting: Emergency Medicine

## 2019-01-11 ENCOUNTER — Encounter (HOSPITAL_BASED_OUTPATIENT_CLINIC_OR_DEPARTMENT_OTHER): Payer: Self-pay | Admitting: Emergency Medicine

## 2019-01-11 DIAGNOSIS — K047 Periapical abscess without sinus: Secondary | ICD-10-CM | POA: Insufficient documentation

## 2019-01-11 DIAGNOSIS — Z79899 Other long term (current) drug therapy: Secondary | ICD-10-CM | POA: Insufficient documentation

## 2019-01-11 DIAGNOSIS — F17200 Nicotine dependence, unspecified, uncomplicated: Secondary | ICD-10-CM | POA: Insufficient documentation

## 2019-01-11 MED ORDER — NAPROXEN 500 MG PO TABS: 500.0000 mg | ORAL_TABLET | Freq: Two times a day (BID) | ORAL | 0 refills | Status: AC

## 2019-01-11 MED ORDER — NAPROXEN 250 MG PO TABS
500.0000 mg | ORAL_TABLET | Freq: Once | ORAL | Status: AC
  Administered 2019-01-11: 14:00:00 500 mg via ORAL
  Filled 2019-01-11: qty 2

## 2019-01-11 MED ORDER — AMOXICILLIN 500 MG PO CAPS: 500.0000 mg | ORAL_CAPSULE | Freq: Three times a day (TID) | ORAL | 0 refills | Status: DC

## 2019-01-11 MED ORDER — AMOXICILLIN 500 MG PO CAPS
500.0000 mg | ORAL_CAPSULE | Freq: Once | ORAL | Status: AC
  Administered 2019-01-11: 500 mg via ORAL
  Filled 2019-01-11: qty 1
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
## 2019-01-11 NOTE — Discharge Instructions (Signed)
Thank you for allowing me to care for you today. Please return to the emergency department if you have new or worsening symptoms. Take your medications as instructed.  ° °

## 2019-02-13 ENCOUNTER — Emergency Department (HOSPITAL_BASED_OUTPATIENT_CLINIC_OR_DEPARTMENT_OTHER): Payer: Self-pay

## 2019-02-13 ENCOUNTER — Encounter (HOSPITAL_BASED_OUTPATIENT_CLINIC_OR_DEPARTMENT_OTHER): Payer: Self-pay

## 2019-02-13 ENCOUNTER — Other Ambulatory Visit: Payer: Self-pay

## 2019-02-13 ENCOUNTER — Emergency Department (HOSPITAL_BASED_OUTPATIENT_CLINIC_OR_DEPARTMENT_OTHER)
Admission: EM | Admit: 2019-02-13 | Discharge: 2019-02-13 | Disposition: A | Discharge status: Home / Self Care | Payer: Self-pay | Attending: Emergency Medicine | Admitting: Emergency Medicine

## 2019-02-13 DIAGNOSIS — F17200 Nicotine dependence, unspecified, uncomplicated: Secondary | ICD-10-CM | POA: Insufficient documentation

## 2019-02-13 DIAGNOSIS — Z79899 Other long term (current) drug therapy: Secondary | ICD-10-CM | POA: Insufficient documentation

## 2019-02-13 DIAGNOSIS — R569 Unspecified convulsions: Secondary | ICD-10-CM | POA: Insufficient documentation

## 2019-02-13 DIAGNOSIS — M25511 Pain in right shoulder: Secondary | ICD-10-CM | POA: Insufficient documentation

## 2019-02-13 MED ORDER — LEVETIRACETAM 750 MG PO TABS: 750.0000 mg | ORAL_TABLET | Freq: Two times a day (BID) | ORAL | 0 refills | Status: DC

## 2019-02-13 MED ORDER — LEVETIRACETAM 500 MG PO TABS
1500.0000 mg | ORAL_TABLET | Freq: Once | ORAL | Status: AC
  Administered 2019-02-13: 1500 mg via ORAL
  Filled 2019-02-13: qty 3

## 2019-02-13 NOTE — ED Provider Notes (Signed)
[facility] EMERGENCY DEPARTMENT Provider Note   CSN: [id number] Arrival date & time: 02/13/19  1458    History   Chief Complaint Chief Complaint  Patient presents with  . Seizures    HPI Jordan Blair is a 28 y.o. female.     28 yo F with a chief complaints of right shoulder pain after seizure.  Patient states that she has run out of her home medications because she is trying to change physicians and unfortunately of the middle of the novel coronavirus pandemic.  She has had difficulty getting appointment.  States that her right shoulder hurts worse in the posterior aspect of the anterior.  Worse with movement and palpation.  States that her head hurts a little bit which is typical after seizure.  She denies any worsening confusion denies vomiting.  Denies chest pain abdominal pain back pain.  The history is provided by the patient.  Seizures  Seizure activity on arrival: no   Seizure type:  Grand mal Initial focality:  None Episode characteristics: abnormal movements, confusion and generalized shaking   Postictal symptoms: somnolence   Return to baseline: yes   Severity:  Mild Duration:  5 minutes Timing:  Clustered Number of seizures this episode:  2 Progression:  Resolved Recent head injury:  No recent head injuries PTA treatment:  None History of seizures: yes     Past Medical History:  Diagnosis Date  . Seizures (HCC)     There are no active problems to display for this patient.   Past Surgical History:  Procedure Laterality Date  . INDUCED ABORTION       OB History   No obstetric history on file.      Home Medications    Prior to Admission medications   Medication Sig Start Date End Date Taking? Authorizing Provider  amoxicillin (AMOXIL) 500 MG capsule Take 1 capsule (500 mg total) by mouth 3 (three) times daily. 01/11/19   Arlyn Dunning, PA-C  ibuprofen (ADVIL,MOTRIN) 600 MG tablet Take 1 tablet (600 mg total) by mouth every 6 (six)  hours as needed. 03/02/17   Audry Pili, PA-C  levETIRAcetam (KEPPRA) 750 MG tablet Take 1 tablet (750 mg total) by mouth 2 (two) times daily. 02/13/19   Melene Plan, DO    Family History No family history on file.  Social History Social History   Tobacco Use  . Smoking status: Current Every Day Smoker    Packs/day: 0.50  . Smokeless tobacco: Never Used  Substance Use Topics  . Alcohol use: Yes    Comment: social  . Drug use: Yes    Types: Marijuana     Allergies   Patient has no known allergies.   Review of Systems Review of Systems  Constitutional: Negative for chills and fever.  HENT: Negative for congestion and rhinorrhea.   Eyes: Negative for redness and visual disturbance.  Respiratory: Negative for shortness of breath and wheezing.   Cardiovascular: Negative for chest pain and palpitations.  Gastrointestinal: Negative for nausea and vomiting.  Genitourinary: Negative for dysuria and urgency.  Musculoskeletal: Positive for arthralgias. Negative for myalgias.  Skin: Negative for pallor and wound.  Neurological: Positive for seizures. Negative for dizziness and headaches.     Physical Exam Updated Vital Signs BP 121/80 (BP Location: Right Arm)   Pulse (!) 102   Temp 98 F (36.7 C) (Oral)   Resp 18   Ht  (1.6 m)   Wt 65.8 kg   LMP  02/05/2019   SpO2 99%   BMI 25.69 kg/m   Physical Exam Vitals signs and nursing note reviewed.  Constitutional:      General: She is not in acute distress.    Appearance: She is well-developed. She is not diaphoretic.  HENT:     Head: Normocephalic and atraumatic.  Eyes:     Pupils: Pupils are equal, round, and reactive to light.  Neck:     Musculoskeletal: Normal range of motion and neck supple.  Cardiovascular:     Rate and Rhythm: Normal rate and regular rhythm.     Heart sounds: No murmur. No friction rub. No gallop.   Pulmonary:     Effort: Pulmonary effort is normal.     Breath sounds: No wheezing or rales.   Abdominal:     General: There is no distension.     Palpations: Abdomen is soft.     Tenderness: There is no abdominal tenderness.  Musculoskeletal:        General: Tenderness present.     Comments: Right shoulder pain  Skin:    General: Skin is warm and dry.  Neurological:     Mental Status: She is alert and oriented to person, place, and time.  Psychiatric:        Behavior: Behavior normal.      ED Treatments / Results  Labs (all labs ordered are listed, but only abnormal results are displayed) Labs Reviewed - No data to display  EKG EKG Interpretation  Date/Time:  Thursday Feb 13 2019 15:08:08 EDT Ventricular Rate:  102 PR Interval:    QRS Duration: 77 QT Interval:  355 QTC Calculation: 463 R Axis:   72 Text Interpretation:  Sinus tachycardia Probable left atrial enlargement No significant change since last tracing Confirmed by Melene Plan [phone]) on 02/13/2019 3:20:57 PM   Radiology Dg Shoulder Right  Result Date: 02/13/2019 CLINICAL DATA:  Right shoulder pain following a seizure. EXAM: RIGHT SHOULDER - 2+ VIEW COMPARISON:  None. FINDINGS: There is no evidence of fracture or dislocation. There is no evidence of arthropathy or other focal bone abnormality. Soft tissues are unremarkable. IMPRESSION: Negative. Electronically Signed   By: Sebastian Ache M.D.   On: 02/13/2019 15:40    Procedures Procedures (including critical care time)  Medications Ordered in ED Medications  levETIRAcetam (KEPPRA) tablet 1,500 mg (1,500 mg Oral Given 02/13/19 1525)     Initial Impression / Assessment and Plan / ED Course  I have reviewed the triage vital signs and the nursing notes.  Pertinent labs & imaging results that were available during my care of the patient were reviewed by me and considered in my medical decision making (see chart for details).        28 yo F with a cc of a seizure.  Has a hx of the same, not on her meds.  Will load on keppra here orally.  Plain film of  the R shoulder.  Plain film the right shoulder is negative for fracture as viewed by me.  We will have the patient follow-up with her neurologist.  She is asking for referral to neurologist in our system.  3:45 PM:  I have discussed the diagnosis/risks/treatment options with the patient and believe the pt to be eligible for discharge home to follow-up with PCP. We also discussed returning to the ED immediately if new or worsening sx occur. We discussed the sx which are most concerning (e.g., sudden worsening pain, fever, inability to tolerate by mouth)  that necessitate immediate return. Medications administered to the patient during their visit and any new prescriptions provided to the patient are listed below.  Medications given during this visit Medications  levETIRAcetam (KEPPRA) tablet 1,500 mg (1,500 mg Oral Given 02/13/19 1525)     The patient appears reasonably screen and/or stabilized for discharge and I doubt any other medical condition or other [facility] requiring further screening, evaluation, or treatment in the ED at this time prior to discharge.    Final Clinical Impressions(s) / ED Diagnoses   Final diagnoses:  Seizure (HCC)  Acute pain of right shoulder    ED Discharge Orders         Ordered    levETIRAcetam (KEPPRA) 750 MG tablet  2 times daily     02/13/19 1545    Ambulatory referral to Neurology    Comments:  Seizure disorder new to the area needs new provider   02/13/19 1545           Melene PlanFloyd, Zsofia Prout, DO 02/13/19 1546

## 2019-02-13 NOTE — ED Notes (Signed)
Patient transported to X-ray 
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
## 2019-02-13 NOTE — Discharge Instructions (Signed)
Take 4 over the counter ibuprofen tablets 3 times a day or 2 over-the-counter naproxen tablets twice a day for pain. Also take tylenol 1000mg (2 extra strength) four times a day.    He have to take your arm out of the sling at least 4 times a day and do range of motion exercises.  Please follow-up with your family doctor.  Take your seizure medicine as prescribed.  Please follow-up with a neurologist.

## 2019-02-13 NOTE — ED Notes (Signed)
Seizure pad placed and warm blankets given

## 2019-07-19 ENCOUNTER — Other Ambulatory Visit: Payer: Self-pay

## 2019-07-19 ENCOUNTER — Emergency Department (HOSPITAL_COMMUNITY)
Admission: EM | Admit: 2019-07-19 | Discharge: 2019-07-19 | Disposition: A | Discharge status: Home / Self Care | Payer: Self-pay | Attending: Emergency Medicine | Admitting: Emergency Medicine

## 2019-07-19 ENCOUNTER — Emergency Department (HOSPITAL_COMMUNITY): Payer: Self-pay

## 2019-07-19 ENCOUNTER — Encounter (HOSPITAL_COMMUNITY): Payer: Self-pay | Admitting: *Deleted

## 2019-07-19 DIAGNOSIS — W06XXXA Fall from bed, initial encounter: Secondary | ICD-10-CM | POA: Insufficient documentation

## 2019-07-19 DIAGNOSIS — Y92013 Bedroom of single-family (private) house as the place of occurrence of the external cause: Secondary | ICD-10-CM | POA: Insufficient documentation

## 2019-07-19 DIAGNOSIS — F1210 Cannabis abuse, uncomplicated: Secondary | ICD-10-CM | POA: Insufficient documentation

## 2019-07-19 DIAGNOSIS — R519 Headache, unspecified: Secondary | ICD-10-CM | POA: Insufficient documentation

## 2019-07-19 DIAGNOSIS — F17210 Nicotine dependence, cigarettes, uncomplicated: Secondary | ICD-10-CM | POA: Insufficient documentation

## 2019-07-19 DIAGNOSIS — Y9389 Activity, other specified: Secondary | ICD-10-CM | POA: Insufficient documentation

## 2019-07-19 DIAGNOSIS — R112 Nausea with vomiting, unspecified: Secondary | ICD-10-CM | POA: Insufficient documentation

## 2019-07-19 DIAGNOSIS — R569 Unspecified convulsions: Secondary | ICD-10-CM | POA: Insufficient documentation

## 2019-07-19 DIAGNOSIS — M25511 Pain in right shoulder: Secondary | ICD-10-CM | POA: Insufficient documentation

## 2019-07-19 DIAGNOSIS — R309 Painful micturition, unspecified: Secondary | ICD-10-CM | POA: Insufficient documentation

## 2019-07-19 DIAGNOSIS — N3000 Acute cystitis without hematuria: Secondary | ICD-10-CM | POA: Insufficient documentation

## 2019-07-19 DIAGNOSIS — Y998 Other external cause status: Secondary | ICD-10-CM | POA: Insufficient documentation

## 2019-07-19 LAB — URINALYSIS, ROUTINE W REFLEX MICROSCOPIC
Bilirubin Urine: NEGATIVE
Glucose, UA: NEGATIVE mg/dL
Hgb urine dipstick: NEGATIVE
Ketones, ur: 5 mg/dL — AB
Nitrite: NEGATIVE
Protein, ur: NEGATIVE mg/dL
Specific Gravity, Urine: 1.023 (ref 1.005–1.030)
pH: 8 (ref 5.0–8.0)

## 2019-07-19 LAB — CBC WITH DIFFERENTIAL/PLATELET
Abs Immature Granulocytes: 0.06 10*3/uL (ref 0.00–0.07)
Basophils Absolute: 0.1 10*3/uL (ref 0.0–0.1)
Basophils Relative: 0 %
Eosinophils Absolute: 0 10*3/uL (ref 0.0–0.5)
Eosinophils Relative: 0 %
HCT: 40.9 % (ref 36.0–46.0)
Hemoglobin: 13.5 g/dL (ref 12.0–15.0)
Immature Granulocytes: 0 %
Lymphocytes Relative: 8 %
Lymphs Abs: 1.2 10*3/uL (ref 0.7–4.0)
MCH: 30.9 pg (ref 26.0–34.0)
MCHC: 33 g/dL (ref 30.0–36.0)
MCV: 93.6 fL (ref 80.0–100.0)
Monocytes Absolute: 0.5 10*3/uL (ref 0.1–1.0)
Monocytes Relative: 3 %
Neutro Abs: 12.8 10*3/uL — ABNORMAL HIGH (ref 1.7–7.7)
Neutrophils Relative %: 89 %
Platelets: 271 10*3/uL (ref 150–400)
RBC: 4.37 MIL/uL (ref 3.87–5.11)
RDW: 12.7 % (ref 11.5–15.5)
WBC: 14.6 10*3/uL — ABNORMAL HIGH (ref 4.0–10.5)
nRBC: 0 % (ref 0.0–0.2)

## 2019-07-19 LAB — COMPREHENSIVE METABOLIC PANEL
ALT: 18 U/L (ref 0–44)
AST: 31 U/L (ref 15–41)
Albumin: 4.6 g/dL (ref 3.5–5.0)
Alkaline Phosphatase: 47 U/L (ref 38–126)
Anion gap: 11 (ref 5–15)
BUN: 13 mg/dL (ref 6–20)
CO2: 21 mmol/L — ABNORMAL LOW (ref 22–32)
Calcium: 9.5 mg/dL (ref 8.9–10.3)
Chloride: 107 mmol/L (ref 98–111)
Creatinine, Ser: 0.7 mg/dL (ref 0.44–1.00)
GFR calc Af Amer: >60 mL/min (ref >60)
GFR calc non Af Amer: >60 mL/min (ref >60)
Glucose, Bld: 145 mg/dL — ABNORMAL HIGH (ref 70–99)
Potassium: 4.1 mmol/L (ref 3.5–5.1)
Sodium: 139 mmol/L (ref 135–145)
Total Bilirubin: <0.1 mg/dL — ABNORMAL LOW (ref 0.3–1.2)
Total Protein: 7.6 g/dL (ref 6.5–8.1)

## 2019-07-19 LAB — RAPID URINE DRUG SCREEN, HOSP PERFORMED
Amphetamines: NOT DETECTED
Barbiturates: NOT DETECTED
Benzodiazepines: NOT DETECTED
Cocaine: NOT DETECTED
Opiates: NOT DETECTED
Tetrahydrocannabinol: POSITIVE — AB

## 2019-07-19 LAB — I-STAT BETA HCG BLOOD, ED (MC, WL, AP ONLY): I-stat hCG, quantitative: <5 m[IU]/mL (ref <5)

## 2019-07-19 LAB — LIPASE, BLOOD: Lipase: 18 U/L (ref 11–51)

## 2019-07-19 MED ORDER — CEPHALEXIN 500 MG PO CAPS: 500.0000 mg | ORAL_CAPSULE | Freq: Three times a day (TID) | ORAL | 0 refills | Status: AC

## 2019-07-19 MED ORDER — CEPHALEXIN 500 MG PO CAPS
500.0000 mg | ORAL_CAPSULE | Freq: Once | ORAL | Status: AC
  Administered 2019-07-19: 500 mg via ORAL
  Filled 2019-07-19: qty 1

## 2019-07-19 MED ORDER — SODIUM CHLORIDE 0.9 % IV BOLUS
1000.0000 mL | Freq: Once | INTRAVENOUS | Status: AC
  Administered 2019-07-19: 1000 mL via INTRAVENOUS

## 2019-07-19 MED ORDER — LEVETIRACETAM 750 MG PO TABS: 750.0000 mg | ORAL_TABLET | Freq: Two times a day (BID) | ORAL | 0 refills | Status: DC

## 2019-07-19 MED ORDER — ONDANSETRON HCL 4 MG/2ML IJ SOLN
4.0000 mg | Freq: Once | INTRAMUSCULAR | Status: AC
  Administered 2019-07-19: 4 mg via INTRAVENOUS
  Filled 2019-07-19: qty 2

## 2019-07-19 MED ORDER — LEVETIRACETAM IN NACL 1000 MG/100ML IV SOLN
1000.0000 mg | Freq: Once | INTRAVENOUS | Status: AC
  Administered 2019-07-19: 1000 mg via INTRAVENOUS
  Filled 2019-07-19: qty 100

## 2019-07-19 NOTE — ED Notes (Signed)
Patient transported to CT 

## 2019-07-19 NOTE — Discharge Instructions (Signed)
Please begin taking your Keppra twice daily again as directed.  Your work-up was reassuring today aside from signs of urinary tract infection, please take antibiotics for the next week as directed.  Make sure you take these with food on your stomach.  Finish entire course even if symptoms improve.  Return to the ED for any new or worsening symptoms.

## 2019-07-19 NOTE — ED Provider Notes (Signed)
[facility]-EMERGENCY DEPT Provider Note   CSN: [id number] Arrival date & time: 07/19/19  [id number]     History   Chief Complaint Chief Complaint  Patient presents with  . Seizures    HPI Jordan Blair is a 28 y.o. female.     Jordan Blair is a 28 y.o. female with known history of seizures, who presents to the ED via EMS for evaluation after patient reported she was having a seizure.  When EMS arrived to scene patient was flailing her arms and legs.  Afterwards she immediately was alert and oriented with no postictal period.  Patient reports this is typical for her seizure, and that she takes Keppra, 750 mg twice daily, but has been out of this for 4 days.  Patient complaining that she thinks with her seizure this caused her to fall out of bed mild pain over the back of the head.  Also reporting some right shoulder pain, reports that she has had previous dislocations from seizures.  She also reports some nausea and vomiting with lower abdominal pain.  Reports some burning with urination but no urinary frequency, no flank pain or hematuria.  Denies vaginal bleeding or discharge.  Denies diarrhea, melena or hematochezia.  She reports she does not typically have nausea and vomiting after seizures.  Denies alcohol or drug use.  No other aggravating or alleviating factors.     Past Medical History:  Diagnosis Date  . Seizures (HCC)     There are no active problems to display for this patient.   Past Surgical History:  Procedure Laterality Date  . INDUCED ABORTION       OB History   No obstetric history on file.      Home Medications    Prior to Admission medications   Medication Sig Start Date End Date Taking? Authorizing Provider  amoxicillin (AMOXIL) 500 MG capsule Take 1 capsule (500 mg total) by mouth 3 (three) times daily. Patient not taking: Reported on 07/19/2019 01/11/19   Ronnie DossMcLean, Kelly A, PA-C  cephALEXin (KEFLEX) 500 MG capsule Take 1 capsule  (500 mg total) by mouth 3 (three) times daily for 7 days. 07/19/19 07/26/19  Dartha LodgeFord, Khaliah Barnick N, PA-C  ibuprofen (ADVIL,MOTRIN) 600 MG tablet Take 1 tablet (600 mg total) by mouth every 6 (six) hours as needed. Patient not taking: Reported on 07/19/2019 03/02/17   Audry PiliMohr, Tyler, PA-C  levETIRAcetam (KEPPRA) 750 MG tablet Take 1 tablet (750 mg total) by mouth 2 (two) times daily. 07/19/19   Dartha LodgeFord, Jeovany Huitron N, PA-C    Family History No family history on file.  Social History Social History   Tobacco Use  . Smoking status: Current Every Day Smoker    Packs/day: 0.50  . Smokeless tobacco: Never Used  Substance Use Topics  . Alcohol use: Yes    Comment: social  . Drug use: Yes    Types: Marijuana     Allergies   Patient has no known allergies.   Review of Systems Review of Systems  Constitutional: Negative for chills and fever.  HENT: Negative.   Respiratory: Negative for cough and shortness of breath.   Cardiovascular: Negative for chest pain.  Gastrointestinal: Positive for abdominal pain, nausea and vomiting. Negative for blood in stool, constipation and diarrhea.  Genitourinary: Positive for dysuria. Negative for frequency, pelvic pain, vaginal bleeding and vaginal discharge.  Musculoskeletal: Negative for arthralgias and myalgias.  Skin: Negative for color change and rash.  Neurological: Positive for seizures. Negative for dizziness,  syncope, facial asymmetry, speech difficulty, weakness, light-headedness, numbness and headaches.     Physical Exam Updated Vital Signs BP (!) 131/95   Pulse 97   Temp 98.3 F (36.8 C) (Oral)   Resp (!) 26   LMP  (Within Months)   Physical Exam Vitals signs and nursing note reviewed.  Constitutional:      General: She is not in acute distress.    Appearance: Normal appearance. She is well-developed and normal weight. She is not diaphoretic.     Comments: Alert, responsive, and in no acute distress  HENT:     Head: Normocephalic and  atraumatic.     Comments: Patient thinks she may have hit her head, no obvious trauma, no palpable hematoma, step-off or deformity, negative battle sign, no CSF otorrhea    Mouth/Throat:     Mouth: Mucous membranes are moist.     Pharynx: Oropharynx is clear.  Eyes:     General:        Right eye: No discharge.        Left eye: No discharge.     Extraocular Movements: Extraocular movements intact.     Conjunctiva/sclera: Conjunctivae normal.     Pupils: Pupils are equal, round, and reactive to light.  Neck:     Musculoskeletal: Neck supple.     Comments: No C-spine tenderness. Cardiovascular:     Rate and Rhythm: Normal rate and regular rhythm.     Heart sounds: Normal heart sounds. No murmur. No friction rub. No gallop.   Pulmonary:     Effort: Pulmonary effort is normal. No respiratory distress.     Breath sounds: Normal breath sounds. No wheezing or rales.     Comments: Respirations equal and unlabored, patient able to speak in full sentences, lungs clear to auscultation bilaterally Abdominal:     General: Bowel sounds are normal. There is no distension.     Palpations: Abdomen is soft. There is no mass.     Tenderness: There is abdominal tenderness. There is no guarding.     Comments: Abdomen soft, nondistended, bowel sounds present throughout, some generalized abdominal tenderness on exam, nonfocal, no guarding or peritoneal signs  Musculoskeletal:        General: No deformity.     Comments: Patient has some tenderness over the right shoulder without obvious deformity, range of motion intact although patient reports pain when reaching above, distal pulses intact, normal strength and sensation.  Skin:    General: Skin is warm and dry.     Capillary Refill: Capillary refill takes less than 2 seconds.  Neurological:     Mental Status: She is alert.     Coordination: Coordination normal.     Comments: Speech is clear, able to follow commands CN III-XII intact Normal strength in  upper and lower extremities bilaterally including dorsiflexion and plantar flexion, strong and equal grip strength Sensation normal to light and sharp touch Moves extremities without ataxia, coordination intact  Psychiatric:        Mood and Affect: Mood normal.        Behavior: Behavior normal.      ED Treatments / Results  Labs (all labs ordered are listed, but only abnormal results are displayed) Labs Reviewed  URINALYSIS, ROUTINE W REFLEX MICROSCOPIC - Abnormal; Notable for the following components:      Result Value   Ketones, ur 5 (*)    Leukocytes,Ua MODERATE (*)    Bacteria, UA RARE (*)    All other  components within normal limits  RAPID URINE DRUG SCREEN, HOSP PERFORMED - Abnormal; Notable for the following components:   Tetrahydrocannabinol POSITIVE (*)    All other components within normal limits  COMPREHENSIVE METABOLIC PANEL - Abnormal; Notable for the following components:   CO2 21 (*)    Glucose, Bld 145 (*)    Total Bilirubin <0.1 (*)    All other components within normal limits  CBC WITH DIFFERENTIAL/PLATELET - Abnormal; Notable for the following components:   WBC 14.6 (*)    Neutro Abs 12.8 (*)    All other components within normal limits  LIPASE, BLOOD  I-STAT BETA HCG BLOOD, ED ([facility], [facility], [facility] ONLY)    EKG None  Radiology Dg Shoulder Right  Result Date: 07/19/2019 CLINICAL DATA:  Shoulder pain s/p seizure today, pt reports prior similar pain w/seizures to shoulder, unsure of dislocation EXAM: RIGHT SHOULDER - 2+ VIEW COMPARISON:  02/13/2019 FINDINGS: There is no evidence of fracture or dislocation. There is no evidence of arthropathy or other focal bone abnormality. Soft tissues are unremarkable. IMPRESSION: Negative. Electronically Signed   By: Corlis Leak M.D.   On: 07/19/2019 08:34   Ct Head Wo Contrast  Result Date: 07/19/2019 CLINICAL DATA:  Pt arrives via [facility] from hotel lobby. On arrival by EMS to scene, pt was "having a seizure" flailing her arms  and legs, she immediately became alert and oriented. Pt takes Keppra but has been out of keppra for 5 days. Unable to remove hair pins. Head trauma, minor, GCS>=13, high clinical risk, initial exam had seizure causing pt to hit head EXAM: CT HEAD WITHOUT CONTRAST TECHNIQUE: Contiguous axial images were obtained from the base of the skull through the vertex without intravenous contrast. COMPARISON:  Head CT 04/05/2019 FINDINGS: Brain: No acute intracranial hemorrhage. Hyperattenuating cavernous angioma again demonstrated in the medial RIGHT parietal lobe adjacent to the RIGHT ventricle (image 21/2). No CT evidence of acute infarction. No midline shift or mass effect. No hydrocephalus. Basilar cisterns are patent. Vascular: No unexpected calcification. Skull: Normal. Negative for fracture or focal lesion. Sinuses/Orbits: Paranasal sinuses and mastoid air cells are clear. Orbits are clear. Other: None. IMPRESSION: 1. No acute intracranial findings.  No change from prior. 2. 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 Electronically Signed   By: Genevive Bi M.D.   On: 07/19/2019 08:42    Procedures Procedures (including critical care time)  Medications Ordered in ED Medications  cephALEXin (KEFLEX) capsule 500 mg (has no administration in time range)  levETIRAcetam (KEPPRA) IVPB 1000 mg/100 mL premix (0 mg Intravenous Stopped 07/19/19 0800)  sodium chloride 0.9 % bolus 1,000 mL (0 mLs Intravenous Stopped 07/19/19 0930)  ondansetron (ZOFRAN) injection 4 mg (4 mg Intravenous Given 07/19/19 0747)     Initial Impression / Assessment and Plan / ED Course  I have reviewed the triage vital signs and the nursing notes.  Pertinent labs & imaging results that were available during my care of the patient were reviewed by me and considered in my medical decision making (see chart for details).  Patient with known history of seizures, treated with Keppra but has been out of her meds for 4 days,  presents via EMS for evaluation of seizure.  On arrival patient has returned to baseline.  I suspect this was seizure in the setting of medication noncompliance, but she does have some vomiting and complaint of abdominal pain as well as right shoulder pain.  Will get labs, urinalysis, CT head as patient  reports she thinks she fell out of her bed hitting her head, and right shoulder x-ray.  Neurologic exam on my evaluation is normal without deficit.  She has mild generalized abdominal tenderness but with no focal pain or peritoneal signs.  Do not think imaging is indicated at this time will wait for lab work.  IV loading dose of Keppra given, as well as fluids and antiemetics.  Lab work shows a leukocytosis of 14.6, not unexpected after seizure activity, normal hemoglobin, no significant electrolyte derangements, glucose 145, normal renal and liver function and normal lipase.  Urinalysis does have moderate leukocytes with WBCs and some bacteria, given urinary symptoms will treat for UTI.  CT head unremarkable, no acute abnormality, cavernous angioma in the right parietal lobe is stable and unchanged.  Right shoulder films are unremarkable.  On reevaluation patient is feeling much better and tolerating p.o. fluids after antinausea medication, no further seizure activity after patient was observed in the ED for multiple hours.  Patient will be given prescription for Keppra as well as Keflex for UTI.  Discharged home in good condition.  Return precautions discussed.  Patient expresses understanding and agreement.    Final Clinical Impressions(s) / ED Diagnoses   Final diagnoses:  Seizure ([place])  Acute cystitis without hematuria    ED Discharge Orders         Ordered    levETIRAcetam (KEPPRA) 750 MG tablet  2 times daily     07/19/19 1340    cephALEXin (KEFLEX) 500 MG capsule  3 times daily     07/19/19 [address] [place], [place] 07/20/19 7989    Lacretia Leigh, MD 07/20/19 [phone]

## 2019-07-19 NOTE — ED Triage Notes (Addendum)
Pt arrives via [facility] from hotel lobby. On arrival by EMS to scene, pt was "having a seizure" flailing her arms and legs, she immediately became alert and oriented. Pt takes Keppra. IV established. cbg 142, VSS. Pt has been out of keppra for 4 days. Hr 60-70's, 144/97. 99% RA

## 2020-06-22 ENCOUNTER — Emergency Department (HOSPITAL_BASED_OUTPATIENT_CLINIC_OR_DEPARTMENT_OTHER)
Admission: EM | Admit: 2020-06-22 | Discharge: 2020-06-22 | Disposition: A | Discharge status: Home / Self Care | Payer: Self-pay | Attending: Emergency Medicine | Admitting: Emergency Medicine

## 2020-06-22 ENCOUNTER — Encounter (HOSPITAL_BASED_OUTPATIENT_CLINIC_OR_DEPARTMENT_OTHER): Payer: Self-pay | Admitting: *Deleted

## 2020-06-22 ENCOUNTER — Other Ambulatory Visit: Payer: Self-pay

## 2020-06-22 DIAGNOSIS — Z79899 Other long term (current) drug therapy: Secondary | ICD-10-CM | POA: Insufficient documentation

## 2020-06-22 DIAGNOSIS — F1210 Cannabis abuse, uncomplicated: Secondary | ICD-10-CM | POA: Insufficient documentation

## 2020-06-22 DIAGNOSIS — R509 Fever, unspecified: Secondary | ICD-10-CM | POA: Insufficient documentation

## 2020-06-22 DIAGNOSIS — F17200 Nicotine dependence, unspecified, uncomplicated: Secondary | ICD-10-CM | POA: Insufficient documentation

## 2020-06-22 DIAGNOSIS — O99891 Other specified diseases and conditions complicating pregnancy: Secondary | ICD-10-CM | POA: Insufficient documentation

## 2020-06-22 DIAGNOSIS — B349 Viral infection, unspecified: Secondary | ICD-10-CM

## 2020-06-22 DIAGNOSIS — R52 Pain, unspecified: Secondary | ICD-10-CM | POA: Insufficient documentation

## 2020-06-22 DIAGNOSIS — R05 Cough: Secondary | ICD-10-CM | POA: Insufficient documentation

## 2020-06-22 DIAGNOSIS — Z3A01 Less than 8 weeks gestation of pregnancy: Secondary | ICD-10-CM | POA: Insufficient documentation

## 2020-06-22 DIAGNOSIS — Z20822 Contact with and (suspected) exposure to covid-19: Secondary | ICD-10-CM | POA: Insufficient documentation

## 2020-06-22 LAB — GROUP A STREP BY PCR: Group A Strep by PCR: NOT DETECTED

## 2020-06-22 LAB — SARS CORONAVIRUS 2 BY RT PCR (HOSPITAL ORDER, PERFORMED IN ~~LOC~~ HOSPITAL LAB): SARS Coronavirus 2: NEGATIVE

## 2020-06-22 MED ORDER — IBUPROFEN 400 MG PO TABS: 400.0000 mg | ORAL_TABLET | Freq: Once | ORAL | Status: DC

## 2020-06-22 MED ORDER — ACETAMINOPHEN 500 MG PO TABS
1000.0000 mg | ORAL_TABLET | Freq: Once | ORAL | Status: AC
  Administered 2020-06-22: 1000 mg via ORAL
  Filled 2020-06-22: qty 2

## 2020-06-22 NOTE — ED Triage Notes (Signed)
C/o fever , body aches, pro cough x 3 days , positive preg test today est. 4 weeks

## 2020-06-22 NOTE — ED Provider Notes (Signed)
[facility] EMERGENCY DEPARTMENT Provider Note   CSN: [id number] Arrival date & time: 06/22/20  1858     History Chief Complaint  Patient presents with  . Generalized Body Aches    Jordan Blair is a 29 y.o. female.  The history is provided by the patient.  Muscle Pain This is a new problem. The current episode started more than 2 days ago. The problem occurs constantly. The problem has not changed since onset.Pertinent negatives include no chest pain, no abdominal pain, no headaches and no shortness of breath. Nothing aggravates the symptoms. Nothing relieves the symptoms. She has tried nothing for the symptoms. The treatment provided no relief.  Patient is [redacted] weeks pregnant with cough and bodyaches and subjectively fevers.        Past Medical History:  Diagnosis Date  . Seizures (HCC)     There are no problems to display for this patient.   Past Surgical History:  Procedure Laterality Date  . INDUCED ABORTION       OB History    Gravida  1   Para      Term      Preterm      AB      Living        SAB      TAB      Ectopic      Multiple      Live Births              History reviewed. No pertinent family history.  Social History   Tobacco Use  . Smoking status: Current Every Day Smoker    Packs/day: 0.50  . Smokeless tobacco: Never Used  Vaping Use  . Vaping Use: Never used  Substance Use Topics  . Alcohol use: Yes    Comment: social  . Drug use: Yes    Types: Marijuana    Home Medications Prior to Admission medications   Medication Sig Start Date End Date Taking? Authorizing Provider  amoxicillin (AMOXIL) 500 MG capsule Take 1 capsule (500 mg total) by mouth 3 (three) times daily. Patient not taking: Reported on 07/19/2019 01/11/19   Arlyn Dunning, PA-C  ibuprofen (ADVIL,MOTRIN) 600 MG tablet Take 1 tablet (600 mg total) by mouth every 6 (six) hours as needed. Patient not taking: Reported on 07/19/2019 03/02/17   Audry Pili, PA-C  levETIRAcetam (KEPPRA) 750 MG tablet Take 1 tablet (750 mg total) by mouth 2 (two) times daily. 07/19/19   Dartha Lodge, PA-C    Allergies    Patient has no known allergies.  Review of Systems   Review of Systems  Constitutional: Negative for unexpected weight change.  HENT: Negative for congestion.   Eyes: Negative for visual disturbance.  Respiratory: Positive for cough. Negative for shortness of breath.   Cardiovascular: Negative for chest pain.  Gastrointestinal: Negative for abdominal pain.  Genitourinary: Negative for difficulty urinating.  Musculoskeletal: Negative for arthralgias.  Neurological: Negative for headaches.  Psychiatric/Behavioral: Negative for agitation.  All other systems reviewed and are negative.   Physical Exam Updated Vital Signs BP 125/83 (BP Location: Right Arm)   Pulse (!) 103   Temp 99.3 F (37.4 C) (Oral)   Resp 18   Ht 5\' 1"  (1.549 m)   Wt 72.6 kg   LMP 05/18/2020 (Approximate)   SpO2 100%   BMI 30.23 kg/m   Physical Exam Vitals and nursing note reviewed.  Constitutional:      General: She is not  in acute distress.    Appearance: Normal appearance.  HENT:     Head: Normocephalic and atraumatic.     Nose: Nose normal.  Eyes:     Conjunctiva/sclera: Conjunctivae normal.     Pupils: Pupils are equal, round, and reactive to light.  Cardiovascular:     Rate and Rhythm: Normal rate and regular rhythm.     Pulses: Normal pulses.     Heart sounds: Normal heart sounds.  Pulmonary:     Effort: Pulmonary effort is normal.     Breath sounds: Normal breath sounds.  Abdominal:     General: Abdomen is flat. Bowel sounds are normal.     Palpations: Abdomen is soft.     Tenderness: There is no abdominal tenderness. There is no guarding.  Musculoskeletal:        General: Normal range of motion.     Cervical back: Normal range of motion and neck supple.  Skin:    General: Skin is warm and dry.     Capillary Refill: Capillary  refill takes less than 2 seconds.  Neurological:     General: No focal deficit present.     Mental Status: She is alert and oriented to person, place, and time.     Deep Tendon Reflexes: Reflexes normal.  Psychiatric:        Mood and Affect: Mood normal.        Behavior: Behavior normal.     ED Results / Procedures / Treatments   Labs (all labs ordered are listed, but only abnormal results are displayed) Labs Reviewed  SARS CORONAVIRUS 2 BY RT PCR (HOSPITAL ORDER, PERFORMED IN [facility])  GROUP A STREP BY PCR    EKG None  Radiology No results found.  Procedures Procedures (including critical care time)  Medications Ordered in ED Medications  acetaminophen (TYLENOL) tablet 1,000 mg (has no administration in time range)    ED Course  I have reviewed the triage vital signs and the nursing notes.  Pertinent labs & imaging results that were available during my care of the patient were reviewed by me and considered in my medical decision making (see chart for details).    Well appearing.  Follow up with your OB for what is on the safe list. Lungs are clear I do not feel imaging is indicated at this time.    Jordan Blair was evaluated in Emergency Department on 06/22/2020 for the symptoms described in the history of present illness. She was evaluated in the context of the global COVID-19 pandemic, which necessitated consideration that the patient might be at risk for infection with the SARS-CoV-2 virus that causes COVID-19. Institutional protocols and algorithms that pertain to the evaluation of patients at risk for COVID-19 are in a state of rapid change based on information released by regulatory bodies including the CDC and federal and state organizations. These policies and algorithms were followed during the patient's care in the ED.  = Final Clinical Impression(s) / ED Diagnoses Return for intractable cough, coughing up blood,fevers >100.4 unrelieved  by medication, shortness of breath, intractable vomiting, chest pain, shortness of breath, weakness,numbness, changes in speech, facial asymmetry,abdominal pain, passing out,Inability to tolerate liquids or food, cough, altered mental status or any concerns. No signs of systemic illness or infection. The patient is nontoxic-appearing on exam and vital signs are within normal limits.   I have reviewed the triage vital signs and the nursing notes. Pertinent labs &imaging results that were available during  my care of the patient were reviewed by me and considered in my medical decision making (see chart for details).After history, exam, and medical workup I feel the patient has beenappropriately medically screened and is safe for discharge home. Pertinent diagnoses were discussed with the patient. Patient was given return precautions.   Herley Bernardini, MD 06/22/20 2325

## 2020-07-16 ENCOUNTER — Other Ambulatory Visit: Payer: Self-pay

## 2020-07-16 ENCOUNTER — Encounter: Payer: Self-pay | Admitting: Family Medicine

## 2020-07-16 ENCOUNTER — Ambulatory Visit (INDEPENDENT_AMBULATORY_CARE_PROVIDER_SITE_OTHER): Payer: 59 | Admitting: Family Medicine

## 2020-07-16 ENCOUNTER — Other Ambulatory Visit (HOSPITAL_COMMUNITY)
Admission: RE | Admit: 2020-07-16 | Discharge: 2020-07-16 | Disposition: A | Discharge status: Home / Self Care | Payer: 59 | Source: Ambulatory Visit | Attending: Family Medicine | Admitting: Family Medicine

## 2020-07-16 VITALS — BP 111/65 | HR 81 | Wt 163.1 lb

## 2020-07-16 DIAGNOSIS — Z3A08 8 weeks gestation of pregnancy: Secondary | ICD-10-CM

## 2020-07-16 DIAGNOSIS — O09899 Supervision of other high risk pregnancies, unspecified trimester: Secondary | ICD-10-CM | POA: Diagnosis not present

## 2020-07-16 DIAGNOSIS — Z23 Encounter for immunization: Secondary | ICD-10-CM

## 2020-07-16 DIAGNOSIS — G40909 Epilepsy, unspecified, not intractable, without status epilepticus: Secondary | ICD-10-CM

## 2020-07-16 MED ORDER — FOLIC ACID 1 MG PO TABS: 1.0000 mg | ORAL_TABLET | Freq: Every day | ORAL | 10 refills | Status: DC

## 2020-07-16 MED ORDER — ONDANSETRON 4 MG PO TBDP: 4.0000 mg | ORAL_TABLET | Freq: Four times a day (QID) | ORAL | 6 refills | Status: DC | PRN

## 2020-07-16 NOTE — Progress Notes (Signed)
Subjective:  Jordan Blair is a [id number] [redacted]w[redacted]d being seen today for her first obstetrical visit.  Her obstetrical history is significant for 2 prior preterm vaginal deliveries. PPROM both times. Had TBI a few years ago, has seizures as result. Was on keppra, but having breakthrough seizures. Now on tegratol, but stopped when she found out she was pregnant. Does not have neurologist.. Patient uncertain about breast feeding. Pregnancy history fully reviewed.  Patient reports nausea.  BP 111/65   Pulse 81   Wt 163 lb 1.9 oz (74 kg)   LMP 05/20/2020   BMI 30.82 kg/m   HISTORY: OB History  Gravida Para Term Preterm AB Living  3 2   2   2   SAB TAB Ectopic Multiple Live Births          2    # Outcome Date GA Lbr Len/2nd Weight Sex Delivery Anes PTL Lv  3 Current           2 Preterm 2014 [redacted]w[redacted]d   F Vag-Spont EPI Y LIV  1 Preterm 2012 [redacted]w[redacted]d   F Vag-Spont EPI Y LIV    Past Medical History:  Diagnosis Date  . Seizures (HCC)     Past Surgical History:  Procedure Laterality Date  . INDUCED ABORTION      History reviewed. No pertinent family history.   Exam  BP 111/65   Pulse 81   Wt 163 lb 1.9 oz (74 kg)   LMP 05/20/2020   BMI 30.82 kg/m   Chaperone present during exam  CONSTITUTIONAL: Well-developed, well-nourished female in no acute distress.  HENT:  Normocephalic, atraumatic, External right and left ear normal. Oropharynx is clear and moist EYES: Conjunctivae and EOM are normal. Pupils are equal, round, and reactive to light. No scleral icterus.  NECK: Normal range of motion, supple, no masses.  Normal thyroid.  CARDIOVASCULAR: Normal heart rate noted, regular rhythm RESPIRATORY: Clear to auscultation bilaterally. Effort and breath sounds normal, no problems with respiration noted. BREASTS: Symmetric in size. No masses, skin changes, nipple drainage, or lymphadenopathy. ABDOMEN: Soft, normal bowel sounds, no distention noted.  No tenderness, rebound or guarding.   PELVIC: Normal appearing external genitalia; normal appearing vaginal mucosa and cervix. No abnormal discharge noted. Normal uterine size, no other palpable masses, no uterine or adnexal tenderness. MUSCULOSKELETAL: Normal range of motion. No tenderness.  No cyanosis, clubbing, or edema.  2+ distal pulses. SKIN: Skin is warm and dry. No rash noted. Not diaphoretic. No erythema. No pallor. NEUROLOGIC: Alert and oriented to person, place, and time. Normal reflexes, muscle tone coordination. No cranial nerve deficit noted. PSYCHIATRIC: Normal mood and affect. Normal behavior. Normal judgment and thought content.    Assessment:    Pregnancy: 07/20/2020 Patient Active Problem List   Diagnosis Date Noted  . Supervision of other high risk pregnancy, antepartum 07/16/2020  . Hx of preterm delivery, currently pregnant 07/16/2020      Plan:   1. [redacted] weeks gestation of pregnancy  2. Supervision of other high risk pregnancy, antepartum FHT and FH normal.  Desires panorama. Discussed practice, use of midwives, fellows. Delivery at [facility] hospital. - Culture, OB Urine - Flu Vaccine QUAD 36+ mos IM - SMN1 COPY NUMBER ANALYSIS (SMA Carrier Screen) - Hemoglobpathy+Fer w/A Thal Rfx - CBC/D/Plt+RPR+Rh+ABO+Rub Ab... - CHL AMB BABYSCRIPTS OPT IN - Cytology - PAP( [place]) - POC Urinalysis Dipstick OB - [facility] MFM OB Comp Less 14 Wks; Future - Ambulatory referral to Neurology  3. Hx of preterm delivery,  currently pregnant Discussed makena - patient would like to get injections  4. Nonintractable epilepsy without status epilepticus, unspecified epilepsy type [facility]) Refer to neurology. Check early ultrasound as tegratol can cause cleft lip/palate Folic acid supplementation - [place] MFM OB Comp Less 14 Wks; Future - Ambulatory referral to Neurology     Problem list reviewed and updated. 75% of 30 min visit spent on counseling and coordination of care.     Levie Heritage 07/16/2020

## 2020-07-16 NOTE — Progress Notes (Signed)
DATING AND VIABILITY SONOGRAM   Jordan Blair is a 29 y.o. year old G3P0202 with LMP Patient's last menstrual period was 05/20/2020. which would correlate to  [redacted]w[redacted]d weeks gestation.  She has regular menstrual cycles.   She is here today for a confirmatory initial sonogram.    GESTATION: SINGLETON   FETAL ACTIVITY:          Heart rate         167          The fetus is active.  GESTATIONAL AGE AND  BIOMETRICS:  Gestational criteria: Estimated Date of Delivery: 02/24/21 by LMP now at [redacted]w[redacted]d  Previous Scans:0  GESTATIONAL SAC           2.53 cm         7-4 weeks  CROWN RUMP LENGTH           1.47 cm         7-6weeks                                                                               AVERAGE EGA(BY THIS SCAN):  7 weeks 5 days  WORKING EDD( LMP ):  02-25-2020     TECHNICIAN COMMENTS:  Patient informed that the ultrasound is considered a limited obstetric ultrasound and is not intended to be a complete ultrasound exam. Patient also informed that the ultrasound is not being completed with the intent of assessing for fetal or placental anomalies or any pelvic abnormalities. Explained that the purpose of today's ultrasound is to assess for fetal heart rate. Patient acknowledges the purpose of the exam and the limitations of the study.     Armandina Stammer 07/16/2020 9:52 AM

## 2020-07-16 NOTE — Progress Notes (Signed)
Makena form was faxed on 07/16/20.  Tacoya Altizer l Pepper Wyndham, CMA

## 2020-07-19 LAB — CULTURE, OB URINE

## 2020-07-19 LAB — URINE CULTURE, OB REFLEX

## 2020-07-21 ENCOUNTER — Telehealth: Payer: Self-pay

## 2020-07-21 ENCOUNTER — Encounter: Payer: Self-pay | Admitting: Family Medicine

## 2020-07-21 DIAGNOSIS — R87613 High grade squamous intraepithelial lesion on cytologic smear of cervix (HGSIL): Secondary | ICD-10-CM | POA: Insufficient documentation

## 2020-07-21 LAB — CYTOLOGY - PAP
Chlamydia: NEGATIVE
Comment: NEGATIVE
Comment: NEGATIVE
Comment: NEGATIVE
Comment: NORMAL
Diagnosis: HIGH — AB
HPV 16: NEGATIVE
HPV 18 / 45: POSITIVE — AB
High risk HPV: POSITIVE — AB
Neisseria Gonorrhea: NEGATIVE

## 2020-07-21 NOTE — Telephone Encounter (Signed)
Pt states she was having some spotting since getting a Pap smear last week. Pt made aware that it is normal to have some spotting after a Pap smear when you're pregnant due to your cervix being sensitive. Pt states she is also having some abdominal pain. Explained to pt the pain could be coming from the uterus growing but if the pain becomes unbearable or if she starts bleeding heavy like a period, she should go to [facility]. Pt also made aware that her Pap smear showed HPV but will need further review by the Provider. Understanding was voiced. Parker Wherley l Darren Caldron, CMA

## 2020-08-13 ENCOUNTER — Other Ambulatory Visit: Payer: Self-pay | Admitting: Family Medicine

## 2020-08-13 ENCOUNTER — Other Ambulatory Visit: Payer: Self-pay | Admitting: *Deleted

## 2020-08-13 ENCOUNTER — Other Ambulatory Visit: Payer: Self-pay

## 2020-08-13 ENCOUNTER — Ambulatory Visit: Payer: 59

## 2020-08-13 ENCOUNTER — Ambulatory Visit: Payer: 59 | Attending: Family Medicine

## 2020-08-13 DIAGNOSIS — G40909 Epilepsy, unspecified, not intractable, without status epilepticus: Secondary | ICD-10-CM | POA: Insufficient documentation

## 2020-08-13 DIAGNOSIS — O09899 Supervision of other high risk pregnancies, unspecified trimester: Secondary | ICD-10-CM

## 2020-08-13 DIAGNOSIS — Z363 Encounter for antenatal screening for malformations: Secondary | ICD-10-CM | POA: Insufficient documentation

## 2020-08-18 ENCOUNTER — Other Ambulatory Visit: Payer: Self-pay

## 2020-08-18 ENCOUNTER — Encounter: Payer: Self-pay | Admitting: Obstetrics & Gynecology

## 2020-08-18 ENCOUNTER — Ambulatory Visit (INDEPENDENT_AMBULATORY_CARE_PROVIDER_SITE_OTHER): Payer: 59 | Admitting: Obstetrics & Gynecology

## 2020-08-18 ENCOUNTER — Other Ambulatory Visit (HOSPITAL_COMMUNITY)
Admission: RE | Admit: 2020-08-18 | Discharge: 2020-08-18 | Disposition: A | Discharge status: Home / Self Care | Payer: 59 | Source: Ambulatory Visit | Attending: Obstetrics & Gynecology | Admitting: Obstetrics & Gynecology

## 2020-08-18 VITALS — BP 116/69 | HR 109 | Wt 158.0 lb

## 2020-08-18 DIAGNOSIS — R87613 High grade squamous intraepithelial lesion on cytologic smear of cervix (HGSIL): Secondary | ICD-10-CM | POA: Insufficient documentation

## 2020-08-18 DIAGNOSIS — N879 Dysplasia of cervix uteri, unspecified: Secondary | ICD-10-CM | POA: Diagnosis not present

## 2020-08-18 DIAGNOSIS — O99352 Diseases of the nervous system complicating pregnancy, second trimester: Secondary | ICD-10-CM

## 2020-08-18 DIAGNOSIS — O3441 Maternal care for other abnormalities of cervix, first trimester: Secondary | ICD-10-CM | POA: Diagnosis present

## 2020-08-18 DIAGNOSIS — G40909 Epilepsy, unspecified, not intractable, without status epilepticus: Secondary | ICD-10-CM

## 2020-08-18 DIAGNOSIS — Z3A12 12 weeks gestation of pregnancy: Secondary | ICD-10-CM

## 2020-08-18 DIAGNOSIS — O09899 Supervision of other high risk pregnancies, unspecified trimester: Secondary | ICD-10-CM

## 2020-08-18 NOTE — Progress Notes (Signed)
PRENATAL VISIT NOTE  Subjective:  Jordan Blair is a 29 y.o. F5D3220 at [redacted]w[redacted]d being seen today for ongoing prenatal care.  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  Patient reports having a mild seizure on 10/16/19 precipitated by stress. She dicontinued the Keppra as she is worried about fetal anomalies, just "trusts in God".. Also emphasizes she did not have seizures during last two pregnancies.   Contractions: Not present. Vag. Bleeding: Scant.  Movement: Absent. Denies leaking of fluid.   The following portions of the patient's history were reviewed and updated as appropriate: allergies, current medications, past family history, past medical history, past social history, past surgical history and problem list.   Objective:   Vitals:   08/18/20 0838  BP: 116/69  Pulse: (!) 109  Weight: 158 lb (71.7 kg)    Fetal Status: Fetal Heart Rate (bpm): 155   Movement: Absent     General:  Alert, oriented and cooperative. Patient is in no acute distress.  Skin: Skin is warm and dry. No rash noted.   Cardiovascular: Normal heart rate noted  Respiratory: Normal respiratory effort, no problems with respiration noted  Abdomen: Soft, gravid, appropriate for gestational age.  Pain/Pressure: Present     Pelvic: Cervical exam performed in the presence of a chaperone for colposcopy, see below        Extremities: Normal range of motion.  Edema: None  Mental Status: Normal mood and affect. Normal behavior. Normal judgment and thought content.    COLPOSCOPY PROCEDURE NOTE  Indications:  High-grade squamous intraepithelial neoplasia  (HGSIL-encompassing moderate and severe dysplasia) pap smear on 07/16/2020; with some features present suspicious  for squamous cell carcinoma.  No previous pap smears seen in our system, patient reports that she has not had one for years.   Discussed this very concerning pap smear and need for biopsies for further evaluation, especially given her pregnancy. Discussed role for HPV in cervical dysplasia. All questions answered. This was discussed with patient and her grandmother, both were appropriately concerned.  Patient gave informed written consent, signed copy in the chart, time out was performed.  Placed in lithotomy position. Cervix viewed with speculum and colposcope after application of acetic acid.   Colposcopy adequate? Yes 7 mm acetowhite lesion with mosaicism noted around 6 o'clock; two corresponding biopsies obtained.  Endocervical curettage not done due to pregnancy, but did collect cells with endocervical brush (similar to what is done during pap smear).  All specimens were labeled and sent to pathology.  Patient did have significant bleeding, ameliorated with pressure using several cotton swabs and application of Monsel's solution.  Patient was given post procedure instructions.  Will follow up pathology and manage accordingly; patient will be contacted with results and recommendations.     Imaging: [place] MFM OB 11-14 WEEK ANATOMY  Result Date: 08/13/2020 ----------------------------------------------------------------------  OBSTETRICS REPORT                       (Signed Final 08/13/2020 02:10 pm) ---------------------------------------------------------------------- Patient Info  ID #:       [id number]                          D.O.B.:  [birth year] (29 yrs)  Name:       Jordan Blair                 Visit Date: 08/13/2020 11:15 am ---------------------------------------------------------------------- Performed By  Attending:        Ma Rings MD         Ref. Address:     [address]                                                              Jordan Blair                                                             [id number]  Performed By:     Jordan Blair        [facility]  Referred By:      Levie Blair                    MD ---------------------------------------------------------------------- Orders  #  Description                           Code        Ordered By  1  [place] MFM OB 11-14 WEEK                  [phone]      Jordan Blair     ANATOMY ----------------------------------------------------------------------  #  Order #                     Accession #  Episode #  1  [id number]                   [id number]                 [id number] ---------------------------------------------------------------------- Indications  Seizure disorder                               O99.350 G40.909  Encounter for antenatal screening for          Z36.3  malformations  Medication exposure during first trimester of  O09.891  pregnancy (trileptal)  Poor obstetric history: Previous preterm       O09.219  delivery, antepartum (29w & 35w) PPROM x  2  Medical complication of pregnancy (hx TBI)     O26.90  [redacted] weeks gestation of pregnancy                Z3A.12 ---------------------------------------------------------------------- Fetal Evaluation  Num Of Fetuses:         1  Preg. Location:         Intrauterine  Gest. Sac:              Intrauterine  Yolk Sac:               Visualized  Fetal Pole:             Visualized  Fetal Heart Rate(bpm):  152  Cardiac Activity:       Observed  Amniotic Fluid  AFI FV:      Within normal limits ---------------------------------------------------------------------- Biometry  CRL:      54.7  mm     G. Age:  12w 0d                  EDD:   02/25/21  ---------------------------------------------------------------------- OB History  Gravidity:    3         Prem:   2  Living:       2 ---------------------------------------------------------------------- Gestational Age  LMP:           12w 1d        Date:  05/20/20                 EDD:   02/24/21  Best:          12w 1d     Det. By:  LMP  (05/20/20)          EDD:   02/24/21 ---------------------------------------------------------------------- Anatomy  Cranium:               Appears normal         Bladder:                Appears normal  Stomach:               Appears normal, left   Upper Extremities:      Visualized                         sided  Cord Vessels:          Appears normal (3      Lower Extremities:      Visualized                         vessel cord) ---------------------------------------------------------------------- Cervix Uterus Adnexa  Cervix  Closed  Uterus  No abnormality visualized.  Right Ovary  Within normal limits.  Left Ovary  Not visualized. No adnexal mass visualized.  Cul De Sac  No free fluid seen.  Adnexa  No abnormality visualized. ---------------------------------------------------------------------- Comments  This patient was seen for a first trimester ultrasound as she  has a history of a seizure disorder that had been treated with  Trileptal.  She discontinued the medication when she found  out that she was pregnant.  The crown-rump length measured today is consistent with her  gestational age.  Although limited due to her early gestational  age, there were no obvious fetal anomalies noted on today's  ultrasound exam.  Based on a Cochrane database review published in 2016  examining women who were exposed to Trileptal  (oxcarbazepine) during pregnancy, the prevalence of major  malformations was similar to that observed in women who  were not exposed to the medication.  The patient was  reassured by the findings of the study.  Due to early exposure to Trileptal, a detailed fetal  anatomy  scan has been scheduled in our office at around 19 weeks.  As she has not had any screening tests for fetal aneuploidy  drawn in her current pregnancy, the patient had a cell free  DNA test (Materni T21) drawn following today's ultrasound  exam.  Our genetic counselor will notify the patient regarding  the results of this test. ----------------------------------------------------------------------                   Jordan RingsVictor Fang, MD Electronically Signed Final Report   08/13/2020 02:10 pm ----------------------------------------------------------------------   Assessment and Plan:  Pregnancy: [id number] at 3226w6d 1. Cervical dysplasia complicating pregnancy, antepartum, first trimester 2. HSIL on Pap smear of cervix Discussed plan for patient with Dr. Adolphus BirchwoodEmma Rossi (GYN ONC) prior to encounter. Will follow up pathology results and manage accordingly.  Patient is aware of concern about neoplasia.  - Surgical pathology  3. Hx of preterm delivery, currently pregnant Makena ordered, will start weekly injections at 16 weeks.  4. 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 Had normal early anatomy scan. Discussed the importance of adherence to medication, especially in light of recent seizure. Discussed risks of seizures to herself and to her fetus.  She is willing to follow up with Neurology as previously referred, and will re-consider taking Keppra and folic acid supplementation. .  5. [redacted] weeks gestation of pregnancy 6. Supervision of other high risk pregnancy, antepartum No other complaints or concerns.  Routine obstetric precautions reviewed. Please refer to After Visit Summary for other counseling recommendations.   Return in about 22 days (around 09/09/2020) for OFFICE OB Visit, 17P initiation at 16 weeks, AFP screen.  Future Appointments  Date Time Provider Department Center  09/16/2020 10:00 AM Levie HeritageStinson, Jordan J, DO [facility] None  09/30/2020   1:45 PM [facility] NURSE [facility]  09/30/2020  2:00 PM [facility] US1 [facility]    Jaynie CollinsUgonna Lita Flynn, MD

## 2020-08-18 NOTE — Patient Instructions (Signed)
Return to office for any scheduled appointments. Call the office or go to the MAU at [facility] if:  You begin to have strong, frequent contractions  Your water breaks.  Sometimes it is a big gush of fluid, sometimes it is just a trickle that keeps getting your panties wet or running down your legs  You have vaginal bleeding.  It is normal to have a small amount of spotting if your cervix was checked.   Any other obstetric concerns.    Colposcopy, Care After This sheet gives you information about how to care for yourself after your procedure. Your health care provider may also give you more specific instructions. If you have problems or questions, contact your health care provider. What can I expect after the procedure? If you had a colposcopy without a biopsy, you can expect to feel fine right away, but you may have some spotting for a few days. You can go back to your normal activities. If you had a colposcopy with a biopsy, it is common to have:  Soreness and pain. This may last for a few days.  Light-headedness.  Mild vaginal bleeding or dark-colored, grainy discharge. This may last for a few days. The discharge may be due to a solution that was used during the procedure. You may need to wear a sanitary pad during this time.  Spotting for at least 48 hours after the procedure. Follow these instructions at home:   Take over-the-counter and prescription medicines only as told by your health care provider. Talk with your health care provider about what type of over-the-counter pain medicine and prescription medicine you can start taking again. It is especially important to talk with your health care provider if you take blood-thinning medicine.  Do not drive or use heavy machinery while taking prescription pain medicine.  For at least 3 days after your procedure, or as long as told by your health care provider, avoid: ? Douching. ? Using  tampons. ? Having sexual intercourse.  Continue to use birth control (contraception).  Limit your physical activity for the first day after the procedure as told by your health care provider. Ask your health care provider what activities are safe for you.  It is up to you to get the results of your procedure. Ask your health care provider, or the department performing the procedure, when your results will be ready.  Keep all follow-up visits as told by your health care provider. This is important. Contact a health care provider if:  You develop a skin rash. Get help right away if:  You are bleeding heavily from your vagina or you are passing blood clots. This includes using more than one sanitary pad per hour for 2 hours in a row.  You have a fever or chills.  You have pelvic pain.  You have abnormal, yellow-colored, or bad-smelling vaginal discharge. This could be a sign of infection.  You have severe pain or cramps in your lower abdomen that do not get better with medicine.  You feel light-headed or dizzy, or you faint. Summary  If you had a colposcopy without a biopsy, you can expect to feel fine right away, but you may have some spotting for a few days. You can go back to your normal activities.  If you had a colposcopy with a biopsy, you may notice mild pain and spotting for 48 hours after the procedure.  Avoid douching, using tampons, and having sexual intercourse for  3 days after the procedure or as long as told by your health care provider.  Contact your health care provider if you have bleeding, severe pain, or signs of infection. This information is not intended to replace advice given to you by your health care provider. Make sure you discuss any questions you have with your health care provider. Document Revised: 09/07/2017 Document Reviewed: 05/12/2016 Elsevier Patient Education  2020 [employer].

## 2020-08-19 ENCOUNTER — Encounter: Payer: Self-pay | Admitting: Obstetrics & Gynecology

## 2020-08-19 DIAGNOSIS — D069 Carcinoma in situ of cervix, unspecified: Secondary | ICD-10-CM | POA: Insufficient documentation

## 2020-08-19 LAB — MATERNIT21 PLUS CORE+SCA
Fetal Fraction: 7
Monosomy X (Turner Syndrome): NOT DETECTED
Result (T21): NEGATIVE
Trisomy 13 (Patau syndrome): NEGATIVE
Trisomy 18 (Edwards syndrome): NEGATIVE
Trisomy 21 (Down syndrome): NEGATIVE
XXX (Triple X Syndrome): NOT DETECTED
XXY (Klinefelter Syndrome): NOT DETECTED
XYY (Jacobs Syndrome): NOT DETECTED

## 2020-08-19 LAB — SURGICAL PATHOLOGY

## 2020-08-19 NOTE — Progress Notes (Signed)
Colposcopy pathology showed CIN III, no evidence of invasion in biopsy specimen. Negative endocervical glandular epithelium (endocervical cells collected with pap smear brush, no curettage done).  Patient was called, discussed results with her over the phone.  Will plan on excisional procedure after delivery, but will confirm with GYN ONC specialists to see if any further surveillance biopsies/studies need to be done during the rest of her pregnancy.  Patient verbalized understanding of plan.  Jaynie Collins, MD

## 2020-08-20 ENCOUNTER — Telehealth: Payer: Self-pay | Admitting: Genetic Counselor

## 2020-08-20 NOTE — Telephone Encounter (Signed)
Second year [employer] genetic counseling student Dara McDougal LVM for [name] re: good news about screening results. Requested a call back to my direct line to discuss these in more detail, as no identifiers were provided in voicemail message.   Gershon Crane, MS, [facility] Genetic Counselor

## 2020-08-23 ENCOUNTER — Emergency Department (HOSPITAL_BASED_OUTPATIENT_CLINIC_OR_DEPARTMENT_OTHER)
Admission: EM | Admit: 2020-08-23 | Discharge: 2020-08-23 | Disposition: A | Discharge status: Home / Self Care | Payer: 59 | Attending: Emergency Medicine | Admitting: Emergency Medicine

## 2020-08-23 ENCOUNTER — Other Ambulatory Visit: Payer: Self-pay

## 2020-08-23 ENCOUNTER — Encounter (HOSPITAL_BASED_OUTPATIENT_CLINIC_OR_DEPARTMENT_OTHER): Payer: Self-pay | Admitting: Emergency Medicine

## 2020-08-23 DIAGNOSIS — O210 Mild hyperemesis gravidarum: Secondary | ICD-10-CM | POA: Insufficient documentation

## 2020-08-23 DIAGNOSIS — Z87891 Personal history of nicotine dependence: Secondary | ICD-10-CM | POA: Diagnosis not present

## 2020-08-23 DIAGNOSIS — Z3A13 13 weeks gestation of pregnancy: Secondary | ICD-10-CM | POA: Insufficient documentation

## 2020-08-23 DIAGNOSIS — O219 Vomiting of pregnancy, unspecified: Secondary | ICD-10-CM | POA: Diagnosis present

## 2020-08-23 LAB — BASIC METABOLIC PANEL
Anion gap: 11 (ref 5–15)
BUN: 7 mg/dL (ref 6–20)
CO2: 24 mmol/L (ref 22–32)
Calcium: 9.8 mg/dL (ref 8.9–10.3)
Chloride: 99 mmol/L (ref 98–111)
Creatinine, Ser: 0.72 mg/dL (ref 0.44–1.00)
GFR, Estimated: >60 mL/min (ref >60)
Glucose, Bld: 106 mg/dL — ABNORMAL HIGH (ref 70–99)
Potassium: 3.3 mmol/L — ABNORMAL LOW (ref 3.5–5.1)
Sodium: 134 mmol/L — ABNORMAL LOW (ref 135–145)

## 2020-08-23 MED ORDER — POTASSIUM CHLORIDE 20 MEQ/15ML (10%) PO SOLN
40.0000 meq | Freq: Once | ORAL | Status: AC
  Administered 2020-08-23: 40 meq via ORAL
  Filled 2020-08-23: qty 30

## 2020-08-23 MED ORDER — PROMETHAZINE HCL 25 MG RE SUPP: 25.0000 mg | Freq: Four times a day (QID) | RECTAL | 0 refills | Status: DC | PRN

## 2020-08-23 MED ORDER — LACTATED RINGERS IV BOLUS
1000.0000 mL | Freq: Once | INTRAVENOUS | Status: AC
  Administered 2020-08-23: 1000 mL via INTRAVENOUS

## 2020-08-23 MED ORDER — PROMETHAZINE HCL 25 MG/ML IJ SOLN
25.0000 mg | Freq: Once | INTRAMUSCULAR | Status: AC
  Administered 2020-08-23: 25 mg via INTRAVENOUS
  Filled 2020-08-23: qty 1

## 2020-08-23 NOTE — Discharge Instructions (Signed)
1.  You already have a prescription for Zofran.  You may use this as needed.  You have also been given a prescription for Phenergan today.  Your symptoms are not controlled by Zofran alone, you may use a Phenergan suppository every 6 hours as needed.  Try to carefully follow eating instructions for hyperemesis gravidarum. 2.  See your [facility] doctor as soon as possible for recheck. 3.  Follow-up with your neurologist tomorrow as scheduled.

## 2020-08-23 NOTE — ED Notes (Signed)
Pt unable to void at this time. Pt aware of need for urine

## 2020-08-23 NOTE — ED Notes (Signed)
Attempted to obtain fetal heart tones on handheld doppler without success, EDP notified, plan to bedside ultrasound.

## 2020-08-23 NOTE — ED Triage Notes (Addendum)
Seen yesterday at [facility] and was given keppra IV States per primary OB she is not supposed to take Keppra. Has been having emesis and unable to keep fluids down. She called her nurse line and was told to come to ed

## 2020-08-23 NOTE — ED Provider Notes (Signed)
[facility] EMERGENCY DEPARTMENT Provider Note   CSN: [id number] Arrival date & time: 08/23/20  3662     History Chief Complaint  Patient presents with   Emesis    Jordan Blair is a 29 y.o. female.  HPI   Jordan Blair is a 29 y.o. H4T6546 at [redacted]w[redacted]d being seen today for persistent nausea and vomiting after treatment yesterday at [facility] emergency department.  She is currently monitored for the following issues for high-risk pregnancy and has Supervision of other high risk pregnancy, antepartum; Hx of preterm delivery, currently pregnant; HSIL on Pap smear of cervix; and Seizure disorder during pregnancy in second trimester [facility]) on her problem list.  EMR reviewed from yesterday's visit.  Patient was treated with hydration and antiemetic Zofran.  She was given Keppra loading dose and advised to continue Keppra.  Note indicates this plan was reviewed extensively with the patient.  Patient reports she has a follow-up with neurology tomorrow.  Patient presents emergency department today reporting she has continued nausea and vomiting.  She reports she never has similar nausea and vomiting in previous pregnancies.  She continues to have lower abdominal discomfort and intense nausea but denies significant abdominal pain that she feels is unusual or changed.  She reports she is still concerned about taking Keppra and thinks that might be the cause of her symptoms.  She questions the guidance given to her yesterday at [facility].  She is concerned that antiepileptics will give her child a cleft lip.  No fever, no diarrhea, no vaginal discharge.  She reports has had some light spotting since having a cervical biopsy last week.       Past Medical History:  Diagnosis Date   Seizures [facility])     Patient Active Problem List   Diagnosis Date Noted   Severe dysplasia of cervix (CIN III) 08/19/2020   Seizure disorder during pregnancy in second  trimester (HCC) 08/18/2020   HSIL on Pap smear of cervix 07/21/2020   Supervision of other high risk pregnancy, antepartum 07/16/2020   Hx of preterm delivery, currently pregnant 07/16/2020    Past Surgical History:  Procedure Laterality Date   INDUCED ABORTION       OB History    Gravida  3   Para  2   Term      Preterm  2   AB      Living  2     SAB      TAB      Ectopic      Multiple      Live Births  2           History reviewed. No pertinent family history.  Social History   Tobacco Use   Smoking status: Former Smoker    Packs/day: 0.50   Smokeless tobacco: Never Used  Building services engineer Use: Never used  Substance Use Topics   Alcohol use: Not Currently    Comment: social   Drug use: Not Currently    Types: Marijuana    Home Medications Prior to Admission medications   Medication Sig Start Date End Date Taking? Authorizing Provider  folic acid (FOLVITE) 1 MG tablet Take 1 tablet (1 mg total) by mouth daily. 07/16/20   Levie Heritage, DO  levETIRAcetam (KEPPRA) 750 MG tablet Take 1 tablet (750 mg total) by mouth 2 (two) times daily. Patient not taking: Reported on 07/16/2020 07/19/19   Dartha Lodge, PA-C  ondansetron (ZOFRAN ODT) 4 MG disintegrating tablet Take 1 tablet (4 mg total) by mouth every 6 (six) hours as needed for nausea. 07/16/20   Levie Heritage, DO  Prenatal Vit-Fe Fumarate-FA (PRENATAL VITAMINS PO) Take by mouth.    [provider]  promethazine (PHENERGAN) 25 MG suppository Place 1 suppository (25 mg total) rectally every 6 (six) hours as needed for nausea or vomiting. 08/23/20   Arby Barrette, MD    Allergies    Amoxicillin  Review of Systems   Review of Systems 10 systems reviewed and negative except as per HPI Physical Exam Updated Vital Signs BP 126/85 (BP Location: Right Arm)    Pulse 82    Temp 98.1 F (36.7 C) (Oral)    Resp 20 Comment: 0919   Ht 5\' 1"  (1.549 m)    Wt 71.7 kg    LMP  05/20/2020    SpO2 99%    BMI 29.85 kg/m   Physical Exam Constitutional:      Comments: Patient is alert and nontoxic.  Clinically well in appearance.  No respiratory distress.  Mental status clear.  HENT:     Head: Normocephalic and atraumatic.     Mouth/Throat:     Mouth: Mucous membranes are moist.     Pharynx: Oropharynx is clear.  Eyes:     Extraocular Movements: Extraocular movements intact.     Conjunctiva/sclera: Conjunctivae normal.  Cardiovascular:     Rate and Rhythm: Normal rate and regular rhythm.  Pulmonary:     Effort: Pulmonary effort is normal.     Breath sounds: Normal breath sounds.  Abdominal:     Comments: Gravid abdomen.  Mild diffuse lower discomfort to palpation.  No guarding.  No upper abdominal pain.  Musculoskeletal:        General: No swelling or tenderness. Normal range of motion.     Right lower leg: No edema.     Left lower leg: No edema.  Skin:    General: Skin is warm and dry.  Neurological:     General: No focal deficit present.     Mental Status: She is oriented to person, place, and time.     Coordination: Coordination normal.  Psychiatric:        Mood and Affect: Mood normal.     ED Results / Procedures / Treatments   Labs (all labs ordered are listed, but only abnormal results are displayed) Labs Reviewed  BASIC METABOLIC PANEL - Abnormal; Notable for the following components:      Result Value   Sodium 134 (*)    Potassium 3.3 (*)    Glucose, Bld 106 (*)    All other components within normal limits  URINALYSIS, ROUTINE W REFLEX MICROSCOPIC    EKG None  Radiology No results found.  Procedures Procedures (including critical care time)  Medications Ordered in ED Medications  lactated ringers bolus 1,000 mL (0 mLs Intravenous Stopped 08/23/20 0843)  promethazine (PHENERGAN) injection 25 mg (25 mg Intravenous Given 08/23/20 0747)  potassium chloride 20 MEQ/15ML (10%) solution 40 mEq (40 mEq Oral Given 08/23/20 0855)     ED Course  I have reviewed the triage vital signs and the nursing notes.  Pertinent labs & imaging results that were available during my care of the patient were reviewed by me and considered in my medical decision making (see chart for details).    MDM Rules/Calculators/A&P  Patient is alert and appropriate.  No active vomiting in the emergency department.  Patient was hydrated yesterday at [facility] hospital and treated for vomiting and mild hypokalemia.  As well, patient was restarted on her Keppra.  Patient continues to have concerns about taking Keppra during pregnancy.  She is scheduled to see her neurologist tomorrow to further review this.  Has not had any seizure in the meantime.  She reports however even after discharge she continued to have nausea and vomiting.  Vital signs are stable.  Patient has very mild hypokalemia at 3.3 mEq.  Rehydrated with 1 L lactated Ringer's and given oral dose of potassium 40 mEq.  She has tolerated this with sips of ginger ale.  At this time patient is counseled on home management of hyperemesis gravidarum with discharge instructions.  Patient counseled on the potentially recurrent nature of this condition during early pregnancy.  At this time, patient is clinically well with normal vital signs and tolerating oral intake.  She has been IV hydrated yesterday and today.  Stable for discharge with close continued follow-up with OB/GYN and neurology. Final Clinical Impression(s) / ED Diagnoses Final diagnoses:  Hyperemesis gravidarum    Rx / DC Orders ED Discharge Orders         Ordered    promethazine (PHENERGAN) 25 MG suppository  Every 6 hours PRN        08/23/20 0935           Arby Barrette, MD 08/23/20 0940

## 2020-08-24 ENCOUNTER — Telehealth: Payer: Self-pay | Admitting: Genetic Counselor

## 2020-08-24 NOTE — Telephone Encounter (Signed)
Second year [employer] genetic counseling Lyn[place] Dawley McDougal attempt to call Ms. Riviere again to discuss her negative NIPS result; however, call went straight to voicemail. Left another message requesting that she call back my direct line. Tried to call number listed as home phone in Epic but call went straight to voicemail and was for someone named Michelle Nasuti, so did not leave message on that number at this time.  Gershon Crane, MS, [facility] Genetic Counselor

## 2020-08-25 ENCOUNTER — Telehealth: Payer: Self-pay | Admitting: Genetic Counselor

## 2020-08-25 NOTE — Telephone Encounter (Signed)
Received a call back from Jordan Blair to review her negative noninvasive prenatal screening (NIPS) results. Jordan Blair had MaterniT21 testing through [facility]. These negative results demonstrated an expected representation of chromosome 21, 18, 13, and sex chromosome material, greatly reducing the likelihood of trisomies 83, 59, or 28 and sex chromosome aneuploidies for the pregnancy. Jordan Blair requested to know about the expected fetal sex, which is female.  NIPS analyzes placental (fetal) DNA in maternal circulation. NIPS is considered to be highly specific and sensitive, but is not considered to be a diagnostic test. We reviewed that this testing identifies 91-99% of pregnancies with trisomies 27, 63, and 35, as well as sex chromosome abnormalities, but does not test for all genetic conditions. Diagnostic testing via [facility] or amniocentesis is available should she be interested in confirming this result. She confirmed that she had no questions about these results at this time.  Gershon Crane, MS, [facility] Genetic Counselor

## 2020-09-16 ENCOUNTER — Encounter: Payer: 59 | Admitting: Family Medicine

## 2020-09-24 ENCOUNTER — Telehealth: Payer: Self-pay

## 2020-09-24 NOTE — Telephone Encounter (Signed)
Patient called and states she is out of jail now and would like to be seen. Patient placed on schedule.    Patient Makena representative Tobi Bastos called and made aware that patient is out of jail. Will re openMakena case.   Makena needs to be prior authorized with [facility]. Their Prior authorization number is [id number]. Was on hold for 20 + minutes trying to get authorization with no success. Armandina Stammer RN

## 2020-09-27 ENCOUNTER — Ambulatory Visit (INDEPENDENT_AMBULATORY_CARE_PROVIDER_SITE_OTHER): Payer: 59 | Admitting: Obstetrics & Gynecology

## 2020-09-27 ENCOUNTER — Encounter: Payer: Self-pay | Admitting: Obstetrics & Gynecology

## 2020-09-27 ENCOUNTER — Other Ambulatory Visit: Payer: Self-pay

## 2020-09-27 VITALS — BP 111/69 | HR 81 | Wt 159.0 lb

## 2020-09-27 DIAGNOSIS — O99352 Diseases of the nervous system complicating pregnancy, second trimester: Secondary | ICD-10-CM

## 2020-09-27 DIAGNOSIS — O09899 Supervision of other high risk pregnancies, unspecified trimester: Secondary | ICD-10-CM

## 2020-09-27 DIAGNOSIS — Z3A18 18 weeks gestation of pregnancy: Secondary | ICD-10-CM

## 2020-09-27 DIAGNOSIS — G40909 Epilepsy, unspecified, not intractable, without status epilepticus: Secondary | ICD-10-CM

## 2020-09-27 DIAGNOSIS — R87613 High grade squamous intraepithelial lesion on cytologic smear of cervix (HGSIL): Secondary | ICD-10-CM

## 2020-09-27 NOTE — Patient Instructions (Signed)

## 2020-09-27 NOTE — Progress Notes (Signed)
   PRENATAL VISIT NOTE  Subjective:  Jordan Blair is a 29 y.o. [id number] at [redacted]w[redacted]d being seen today for ongoing prenatal care.  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  Patient reports Pt reports that this pregnancy is totally differnet then the prev pregnancy. She just does not feel well  . Pt has had some seizures but, does not want to take meds because of concern for cleft lip and palate.  Contractions: Not present. Vag. Bleeding: None.  Movement: Present. Denies leaking of fluid.   The following portions of the patient's history were reviewed and updated as appropriate: allergies, current medications, past family history, past medical history, past social history, past surgical history and problem list.   Objective:   Vitals:   09/27/20 0904  BP: 111/69  Pulse: 81  Weight: 159 lb (72.1 kg)    Fetal Status: Fetal Heart Rate (bpm): 145   Movement: Present     General:  Alert, oriented and cooperative. Patient is in no acute distress.  Skin: Skin is warm and dry. No rash noted.   Cardiovascular: Normal heart rate noted  Respiratory: Normal respiratory effort, no problems with respiration noted  Abdomen: Soft, gravid, appropriate for gestational age.  Pain/Pressure: Present     Pelvic: Cervical exam deferred        Extremities: Normal range of motion.  Edema: Trace  Mental Status: Normal mood and affect. Normal behavior. Normal judgment and thought content.   Assessment and Plan:  Pregnancy: [id number] at [redacted]w[redacted]d 1. Supervision of other high risk pregnancy, antepartum Anatomy scan on next week  2. Hx of preterm delivery, currently pregnant Wants to begin 17-OH-P. The full order was not  completed due to lack of follow up. The order has been resent   3. HSIL on Pap smear of cervix 08/18/2020 CERVIX, 6 O'CLOCK, BIOPSY:  - High grade squamous intraepithelial lesion, CIN-III (severe  dysplasia/carcinoma in situ).   B. ENDOCERVIX, BIOPSY:  - Benign endocervical glandular epithelium.   Needs excisional bx after delivery.   4. Seizure disorder during pregnancy in second trimester [employer] [employer]) Neuro consult. Missed last visit due to being incarcerated.  Pt declines restarting Keppra at preset. Agrees to see Neuro.   Preterm labor symptoms and general obstetric precautions including but not limited to vaginal bleeding, contractions, leaking of fluid and fetal movement were reviewed in detail with the patient. Please refer to After Visit Summary for other counseling recommendations.   Return in about 4 weeks (around 10/25/2020) for in person.  Future Appointments  Date Time Provider Department Center  09/30/2020  1:45 PM [facility] NURSE [facility]  09/30/2020  2:00 PM [facility] US1 [facility]    Willodean Rosenthal, MD

## 2020-09-27 NOTE — Addendum Note (Signed)
Addended by: Mikey Bussing on: 09/27/2020 01:37 PM   Modules accepted: Orders

## 2020-09-30 ENCOUNTER — Encounter: Payer: Self-pay | Admitting: *Deleted

## 2020-09-30 ENCOUNTER — Other Ambulatory Visit: Payer: Self-pay | Admitting: Obstetrics

## 2020-09-30 ENCOUNTER — Ambulatory Visit: Payer: 59 | Admitting: *Deleted

## 2020-09-30 ENCOUNTER — Other Ambulatory Visit: Payer: Self-pay

## 2020-09-30 ENCOUNTER — Ambulatory Visit: Payer: 59 | Attending: Obstetrics and Gynecology

## 2020-09-30 DIAGNOSIS — O09899 Supervision of other high risk pregnancies, unspecified trimester: Secondary | ICD-10-CM | POA: Diagnosis present

## 2020-09-30 DIAGNOSIS — O09212 Supervision of pregnancy with history of pre-term labor, second trimester: Secondary | ICD-10-CM

## 2020-09-30 DIAGNOSIS — Z3A19 19 weeks gestation of pregnancy: Secondary | ICD-10-CM

## 2020-09-30 DIAGNOSIS — O321XX0 Maternal care for breech presentation, not applicable or unspecified: Secondary | ICD-10-CM | POA: Diagnosis not present

## 2020-09-30 DIAGNOSIS — O09891 Supervision of other high risk pregnancies, first trimester: Secondary | ICD-10-CM | POA: Diagnosis not present

## 2020-09-30 DIAGNOSIS — G40909 Epilepsy, unspecified, not intractable, without status epilepticus: Secondary | ICD-10-CM

## 2020-09-30 DIAGNOSIS — O99352 Diseases of the nervous system complicating pregnancy, second trimester: Secondary | ICD-10-CM

## 2020-09-30 NOTE — Telephone Encounter (Signed)
Filled out prior authorization forms for [facility] pharmacy with [facility]. Received a return fax that they dont have any patient matching her information. Armandina Stammer RN

## 2020-10-04 ENCOUNTER — Other Ambulatory Visit: Payer: Self-pay | Admitting: *Deleted

## 2020-10-04 DIAGNOSIS — G40909 Epilepsy, unspecified, not intractable, without status epilepticus: Secondary | ICD-10-CM

## 2020-10-07 LAB — CBC/D/PLT+RPR+RH+ABO+RUB AB...
Antibody Screen: NEGATIVE
Basophils Absolute: 0 10*3/uL (ref 0.0–0.2)
Basos: 0 %
EOS (ABSOLUTE): 0.2 10*3/uL (ref 0.0–0.4)
Eos: 2 %
HCV Ab: <0.1 s/co ratio (ref 0.0–0.9)
HIV Screen 4th Generation wRfx: NONREACTIVE
Hematocrit: 35.8 % (ref 34.0–46.6)
Hemoglobin: 11.9 g/dL (ref 11.1–15.9)
Hepatitis B Surface Ag: NEGATIVE
Immature Grans (Abs): 0.1 10*3/uL (ref 0.0–0.1)
Immature Granulocytes: 1 %
Lymphocytes Absolute: 2.1 10*3/uL (ref 0.7–3.1)
Lymphs: 22 %
MCH: 29.2 pg (ref 26.6–33.0)
MCHC: 33.2 g/dL (ref 31.5–35.7)
MCV: 88 fL (ref 79–97)
Monocytes Absolute: 0.6 10*3/uL (ref 0.1–0.9)
Monocytes: 7 %
Neutrophils Absolute: 6.3 10*3/uL (ref 1.4–7.0)
Neutrophils: 68 %
Platelets: 330 10*3/uL (ref 150–450)
RBC: 4.07 x10E6/uL (ref 3.77–5.28)
RDW: 12.5 % (ref 11.7–15.4)
RPR Ser Ql: NONREACTIVE
Rh Factor: POSITIVE
Rubella Antibodies, IGG: 1.35 index (ref >0.99)
WBC: 9.3 10*3/uL (ref 3.4–10.8)

## 2020-10-07 LAB — SMN1 COPY NUMBER ANALYSIS (SMA CARRIER SCREENING)

## 2020-10-07 LAB — HEMOGLOBPATHY+FER W/A THAL RFX
Ferritin: 88 ng/mL (ref 15–150)
Hgb A2: 3.1 % (ref 1.8–3.2)
Hgb A: 96.9 % (ref 96.4–98.8)
Hgb F: 0 % (ref 0.0–2.0)
Hgb S: 0 %

## 2020-10-07 LAB — HCV INTERPRETATION

## 2020-10-09 NOTE — L&D Delivery Note (Signed)
Operative Delivery Note  Jordan Blair is a 30 y.o. female [phone] with IUP at [redacted]w[redacted]d and medical hx significant for seizure disorder admitted for active labor at term.   She progressed to 10 cm without augmentation and Pitocin was started to assist with descent/pushing.  Pt 10 cm x 3.5 hours, pushed for 30 minutes, stopped then pushed ~ 30 minutes more but was sedated and drowsy due to Keppra dose given at 1:00 pm and not able to push effectively. Dr Debroah Loop to room to assess.   At 7:40 PM a viable and healthy female was delivered via Vaginal, [employer], operational) (Presentation: Middle Occiput Posterior).  APGAR: 3, 7; weight pending.  Cord pH: Cord gases drawn but not processed/resulted due to maternal emergency, see below.  Presentation: vertex; Position: Occiput,, Anterior; Station: +2.  Verbal consent: obtained from patient.  Risks and benefits discussed in detail.  Risks include, but are not limited to the risks of anesthesia, bleeding, infection, damage to maternal tissues, fetal cephalhematoma.  There is also the risk of inability to effect vaginal delivery of the head, or shoulder dystocia that cannot be resolved by established maneuvers, leading to the need for emergency cesarean section.  Placenta status: Spontaneous, Intact.  Cord: 3 vessels with the following complications: None.    Anesthesia: Epidural Episiotomy: None Lacerations: None Suture Repair: n/a Est. Blood Loss (mL): 100  After VAVD,  pt family member reported to staff that the patient was about to have a seizure.  Pt did proceed to have a grand mal seizure after delivery of infant but prior to delivery of the placenta.  Additional RN staff to room, assisted with positioning of pt, and administered Ativan IV 2 mg.  Initially pt VS stable, but then pt HR dropped to 40s and O2 sat to 70% and rapid response team was called.  Second IV ordered and started with labs drawn.  With heart rate remaining low and bag/mask required to  maintain O2, code team called to room.  Pt stabilized and became more alert and oriented, asking about her baby.  Placenta was delivered after pt stabilized.  Mom to postpartum.  Baby stable and in room with family member until pt able to bond with infant. Pt plans inpatient Nexplanon for contraception.  Sharen Counter 02/16/2021, 8:29 PM

## 2020-10-14 ENCOUNTER — Other Ambulatory Visit: Payer: Self-pay

## 2020-10-14 ENCOUNTER — Ambulatory Visit (INDEPENDENT_AMBULATORY_CARE_PROVIDER_SITE_OTHER): Payer: 59 | Admitting: Family Medicine

## 2020-10-14 VITALS — BP 101/51 | HR 93 | Wt 160.0 lb

## 2020-10-14 DIAGNOSIS — Z3A21 21 weeks gestation of pregnancy: Secondary | ICD-10-CM

## 2020-10-14 DIAGNOSIS — D069 Carcinoma in situ of cervix, unspecified: Secondary | ICD-10-CM

## 2020-10-14 DIAGNOSIS — O09899 Supervision of other high risk pregnancies, unspecified trimester: Secondary | ICD-10-CM

## 2020-10-14 DIAGNOSIS — G40909 Epilepsy, unspecified, not intractable, without status epilepticus: Secondary | ICD-10-CM

## 2020-10-14 DIAGNOSIS — O99352 Diseases of the nervous system complicating pregnancy, second trimester: Secondary | ICD-10-CM

## 2020-10-14 NOTE — Progress Notes (Signed)
   PRENATAL VISIT NOTE  Subjective:  Locklyn Henriquez is a 30 y.o. G4W1027 at 107w0d being seen today for ongoing prenatal care.  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  Patient reports no complaints.  Contractions: Not present. Vag. Bleeding: None.  Movement: Present. Denies leaking of fluid.   The following portions of the patient's history were reviewed and updated as appropriate: allergies, current medications, past family history, past medical history, past social history, past surgical history and problem list.   Objective:   Vitals:   10/14/20 1310  BP: (!) 101/51  Pulse: 93  Weight: 160 lb (72.6 kg)    Fetal Status: Fetal Heart Rate (bpm): 154 Fundal Height: 21 cm Movement: Present     General:  Alert, oriented and cooperative. Patient is in no acute distress.  Skin: Skin is warm and dry. No rash noted.   Cardiovascular: Normal heart rate noted  Respiratory: Normal respiratory effort, no problems with respiration noted  Abdomen: Soft, gravid, appropriate for gestational age.  Pain/Pressure: Absent     Pelvic: Cervical exam deferred        Extremities: Normal range of motion.  Edema: Trace  Mental Status: Normal mood and affect. Normal behavior. Normal judgment and thought content.   Assessment and Plan:  Pregnancy: [id number] at [redacted]w[redacted]d 1. [redacted] weeks gestation of pregnancy - AFP, Serum, Open Spina Bifida  2. Supervision of other high risk pregnancy, antepartum FHT and FH normal - AFP, Serum, Open Spina Bifida  3. Hx of preterm delivery, currently pregnant Trying to get Makena - patient lost insurance and is going to apply for pregnancy medicaid. May not be able to get Makena  4.  Seizure disorder during pregnancy in second trimester [facility]) Has been taking intermittently because she thought that taking the medication would make her baby have epilepsy. Discussed that this does not happen and that having uncontrolled seizures are worse for the baby than taking the antiepileptic medications. Patient will start to take the medication as prescribed. Scheduled for fetal echo.  5. Severe dysplasia of cervix (CIN III) Repeat Colpo at 26-30 weeks.  Term labor symptoms and general obstetric precautions including but not limited to vaginal bleeding, contractions, leaking of fluid and fetal movement were reviewed in detail with the patient. Please refer to After Visit Summary for other counseling recommendations.   Return in about 4 weeks (around 11/11/2020) for OB f/u, 2 hr GTT, In Office.  Future Appointments  Date Time Provider Department Center  10/18/2020 12:30 PM [facility] NURSE [facility]  10/18/2020 12:45 PM [facility] US5 [facility]  10/27/2020  1:30 PM [facility]URSE [facility]  10/27/2020  1:45 PM [facility] US5 [facility]  11/11/2020  1:00 PM Levie Heritage, DO [facility] None  12/02/2020 10:30 AM Micki Riley, MD [facility] None  12/03/2020  8:30 AM Willodean Rosenthal, MD [facility] None    Levie Heritage, DO

## 2020-10-16 LAB — AFP, SERUM, OPEN SPINA BIFIDA
AFP MoM: 1.14
AFP Value: 78.3 ng/mL
Gest. Age on Collection Date: 21 weeks
Maternal Age At EDD: 30.7 yr
OSBR Risk 1 IN: 10000
Test Results:: NEGATIVE
Weight: 160 [lb_av]

## 2020-10-18 ENCOUNTER — Telehealth: Payer: Self-pay | Admitting: Obstetrics and Gynecology

## 2020-10-18 ENCOUNTER — Ambulatory Visit: Payer: 59

## 2020-10-18 NOTE — Telephone Encounter (Signed)
PATIENT CALLED TO CANCEL SHE WILL COME NEXT WEEK

## 2020-10-27 ENCOUNTER — Ambulatory Visit: Payer: 59

## 2020-11-02 ENCOUNTER — Ambulatory Visit: Payer: Medicaid Other

## 2020-11-03 ENCOUNTER — Ambulatory Visit: Payer: Medicaid Other

## 2020-11-11 ENCOUNTER — Encounter: Payer: 59 | Admitting: Family Medicine

## 2020-11-11 ENCOUNTER — Ambulatory Visit (INDEPENDENT_AMBULATORY_CARE_PROVIDER_SITE_OTHER): Payer: Medicaid Other | Admitting: Family Medicine

## 2020-11-11 ENCOUNTER — Other Ambulatory Visit: Payer: Self-pay

## 2020-11-11 ENCOUNTER — Encounter: Payer: Self-pay | Admitting: Family Medicine

## 2020-11-11 VITALS — BP 110/63 | HR 100 | Wt 164.0 lb

## 2020-11-11 DIAGNOSIS — Z8751 Personal history of pre-term labor: Secondary | ICD-10-CM

## 2020-11-11 DIAGNOSIS — G40909 Epilepsy, unspecified, not intractable, without status epilepticus: Secondary | ICD-10-CM

## 2020-11-11 DIAGNOSIS — O09899 Supervision of other high risk pregnancies, unspecified trimester: Secondary | ICD-10-CM

## 2020-11-11 DIAGNOSIS — O09213 Supervision of pregnancy with history of pre-term labor, third trimester: Secondary | ICD-10-CM | POA: Diagnosis not present

## 2020-11-11 DIAGNOSIS — Z23 Encounter for immunization: Secondary | ICD-10-CM

## 2020-11-11 DIAGNOSIS — Z3A25 25 weeks gestation of pregnancy: Secondary | ICD-10-CM

## 2020-11-11 DIAGNOSIS — O09892 Supervision of other high risk pregnancies, second trimester: Secondary | ICD-10-CM

## 2020-11-11 DIAGNOSIS — R87613 High grade squamous intraepithelial lesion on cytologic smear of cervix (HGSIL): Secondary | ICD-10-CM

## 2020-11-11 DIAGNOSIS — O99352 Diseases of the nervous system complicating pregnancy, second trimester: Secondary | ICD-10-CM

## 2020-11-11 MED ORDER — LEVETIRACETAM 750 MG PO TABS
750.0000 mg | ORAL_TABLET | Freq: Two times a day (BID) | ORAL | 3 refills | Status: DC
Start: 2020-11-11 — End: 2021-03-09

## 2020-11-11 MED ORDER — FOLIC ACID 1 MG PO TABS
1.0000 mg | ORAL_TABLET | Freq: Every day | ORAL | 10 refills | Status: DC
Start: 2020-11-11 — End: 2021-02-18

## 2020-11-11 MED ORDER — HYDROXYPROGESTERONE CAPROATE 275 MG/1.1ML ~~LOC~~ SOAJ
275.0000 mg | SUBCUTANEOUS | Status: AC
Start: 2020-11-11 — End: 2021-01-20
  Administered 2020-11-11 – 2020-11-18 (×2): 275 mg via SUBCUTANEOUS

## 2020-11-11 NOTE — Progress Notes (Signed)
   PRENATAL VISIT NOTE  Subjective:  Jordan Blair is a 30 y.o. H6P5916 at [redacted]w[redacted]d being seen today for ongoing prenatal care.  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  Patient reports occasional round ligament pain.  Contractions: Not present. Vag. Bleeding: None.  Movement: Present. Denies leaking of fluid.   The following portions of the patient's history were reviewed and updated as appropriate: allergies, current medications, past family history, past medical history, past social history, past surgical history and problem list.   Objective:   Vitals:   11/11/20 0817  BP: 110/63  Pulse: 100  Weight: 164 lb (74.4 kg)    Fetal Status: Fetal Heart Rate (bpm): 145 Fundal Height: 25 cm Movement: Present     General:  Alert, oriented and cooperative. Patient is in no acute distress.  Skin: Skin is warm and dry. No rash noted.   Cardiovascular: Normal heart rate noted  Respiratory: Normal respiratory effort, no problems with respiration noted  Abdomen: Soft, gravid, appropriate for gestational age.  Pain/Pressure: Present     Pelvic: Cervical exam deferred        Extremities: Normal range of motion.  Edema: Trace  Mental Status: Normal mood and affect. Normal behavior. Normal judgment and thought content.   Assessment and Plan:  Pregnancy: B8G6659 at [redacted]w[redacted]d 1. Supervision of other high risk pregnancy, antepartum FHT and FH normal - Glucose Tolerance, 2 Hours w/1 Hour - CBC - RPR - HIV antibody (with reflex)  2. Hx of preterm delivery, currently pregnant Patient approved for makena finally. Discussed potential benefits of starting, even this late. Patient would still like Makena  3.  [redacted] weeks gestation of pregnancy - Glucose Tolerance, 2 Hours w/1 Hour - CBC - RPR - HIV antibody (with reflex)  4. HSIL on Pap smear of cervix Rpt colpo later this month  5. Seizure disorder during pregnancy in second trimester [facility]) Not taking keppra. Missed neuro appt. Will reschedule and restart keppra.  Preterm labor symptoms and general obstetric precautions including but not limited to vaginal bleeding, contractions, leaking of fluid and fetal movement were reviewed in detail with the patient. Please refer to After Visit Summary for other counseling recommendations.   Return in about 4 weeks (around 12/09/2020) for OB f/u.  Future Appointments  Date Time Provider Department Center  11/15/2020  2:45 PM [facility] NURSE [facility]  11/15/2020  3:00 PM [facility] US1 [facility]  11/18/2020  9:00 AM [facility] NURSE [facility] None  11/25/2020  9:00 AM [facility] NURSE [facility] None  12/03/2020  8:30 AM Willodean Rosenthal, MD [facility] None  12/07/2020  8:30 AM York Spaniel, MD [facility] None    Levie Heritage, DO

## 2020-11-12 ENCOUNTER — Encounter: Payer: Self-pay | Admitting: *Deleted

## 2020-11-12 ENCOUNTER — Ambulatory Visit: Payer: Medicaid Other | Attending: Obstetrics

## 2020-11-12 ENCOUNTER — Ambulatory Visit: Payer: Medicaid Other | Admitting: *Deleted

## 2020-11-12 DIAGNOSIS — G40909 Epilepsy, unspecified, not intractable, without status epilepticus: Secondary | ICD-10-CM

## 2020-11-12 DIAGNOSIS — O09899 Supervision of other high risk pregnancies, unspecified trimester: Secondary | ICD-10-CM

## 2020-11-12 DIAGNOSIS — O09212 Supervision of pregnancy with history of pre-term labor, second trimester: Secondary | ICD-10-CM | POA: Diagnosis not present

## 2020-11-12 DIAGNOSIS — Z79811 Long term (current) use of aromatase inhibitors: Secondary | ICD-10-CM

## 2020-11-12 DIAGNOSIS — Z362 Encounter for other antenatal screening follow-up: Secondary | ICD-10-CM | POA: Diagnosis not present

## 2020-11-12 DIAGNOSIS — O99352 Diseases of the nervous system complicating pregnancy, second trimester: Secondary | ICD-10-CM | POA: Diagnosis not present

## 2020-11-12 DIAGNOSIS — Z3A25 25 weeks gestation of pregnancy: Secondary | ICD-10-CM

## 2020-11-12 DIAGNOSIS — O99322 Drug use complicating pregnancy, second trimester: Secondary | ICD-10-CM

## 2020-11-12 LAB — CBC
Hematocrit: 34.9 % (ref 34.0–46.6)
Hemoglobin: 11.7 g/dL (ref 11.1–15.9)
MCH: 29.8 pg (ref 26.6–33.0)
MCHC: 33.5 g/dL (ref 31.5–35.7)
MCV: 89 fL (ref 79–97)
Platelets: 279 10*3/uL (ref 150–450)
RBC: 3.92 x10E6/uL (ref 3.77–5.28)
RDW: 12.6 % (ref 11.7–15.4)
WBC: 8.9 10*3/uL (ref 3.4–10.8)

## 2020-11-12 LAB — GLUCOSE TOLERANCE, 2 HOURS W/ 1HR
Glucose, 1 hour: 134 mg/dL (ref 65–179)
Glucose, 2 hour: 99 mg/dL (ref 65–152)
Glucose, Fasting: 79 mg/dL (ref 65–91)

## 2020-11-12 LAB — HIV ANTIBODY (ROUTINE TESTING W REFLEX): HIV Screen 4th Generation wRfx: NONREACTIVE

## 2020-11-12 LAB — RPR: RPR Ser Ql: NONREACTIVE

## 2020-11-15 ENCOUNTER — Ambulatory Visit: Payer: Medicaid Other

## 2020-11-17 ENCOUNTER — Inpatient Hospital Stay (HOSPITAL_COMMUNITY)
Admission: AD | Admit: 2020-11-17 | Discharge: 2020-11-17 | Disposition: A | Discharge status: Home / Self Care | Payer: Medicaid Other | Attending: Obstetrics and Gynecology | Admitting: Obstetrics and Gynecology

## 2020-11-17 ENCOUNTER — Inpatient Hospital Stay (HOSPITAL_COMMUNITY): Payer: Medicaid Other

## 2020-11-17 ENCOUNTER — Inpatient Hospital Stay (HOSPITAL_BASED_OUTPATIENT_CLINIC_OR_DEPARTMENT_OTHER): Payer: Medicaid Other

## 2020-11-17 ENCOUNTER — Encounter (HOSPITAL_COMMUNITY): Payer: Self-pay | Admitting: Obstetrics and Gynecology

## 2020-11-17 DIAGNOSIS — O09892 Supervision of other high risk pregnancies, second trimester: Secondary | ICD-10-CM

## 2020-11-17 DIAGNOSIS — R109 Unspecified abdominal pain: Secondary | ICD-10-CM

## 2020-11-17 DIAGNOSIS — O99891 Other specified diseases and conditions complicating pregnancy: Secondary | ICD-10-CM | POA: Diagnosis not present

## 2020-11-17 DIAGNOSIS — O09212 Supervision of pregnancy with history of pre-term labor, second trimester: Secondary | ICD-10-CM | POA: Diagnosis not present

## 2020-11-17 DIAGNOSIS — O26892 Other specified pregnancy related conditions, second trimester: Secondary | ICD-10-CM

## 2020-11-17 DIAGNOSIS — Z87891 Personal history of nicotine dependence: Secondary | ICD-10-CM | POA: Insufficient documentation

## 2020-11-17 DIAGNOSIS — R1031 Right lower quadrant pain: Secondary | ICD-10-CM | POA: Diagnosis not present

## 2020-11-17 DIAGNOSIS — R141 Gas pain: Secondary | ICD-10-CM | POA: Insufficient documentation

## 2020-11-17 DIAGNOSIS — Z3A25 25 weeks gestation of pregnancy: Secondary | ICD-10-CM

## 2020-11-17 DIAGNOSIS — G40909 Epilepsy, unspecified, not intractable, without status epilepticus: Secondary | ICD-10-CM

## 2020-11-17 DIAGNOSIS — O99352 Diseases of the nervous system complicating pregnancy, second trimester: Secondary | ICD-10-CM

## 2020-11-17 DIAGNOSIS — Z3689 Encounter for other specified antenatal screening: Secondary | ICD-10-CM

## 2020-11-17 DIAGNOSIS — Z87820 Personal history of traumatic brain injury: Secondary | ICD-10-CM

## 2020-11-17 DIAGNOSIS — Z880 Allergy status to penicillin: Secondary | ICD-10-CM | POA: Diagnosis not present

## 2020-11-17 LAB — URINALYSIS, ROUTINE W REFLEX MICROSCOPIC
Bacteria, UA: NONE SEEN
Bilirubin Urine: NEGATIVE
Glucose, UA: NEGATIVE mg/dL
Hgb urine dipstick: NEGATIVE
Ketones, ur: NEGATIVE mg/dL
Nitrite: NEGATIVE
Protein, ur: NEGATIVE mg/dL
Specific Gravity, Urine: 1.014 (ref 1.005–1.030)
pH: 6 (ref 5.0–8.0)

## 2020-11-17 LAB — COMPREHENSIVE METABOLIC PANEL
ALT: 9 U/L (ref 0–44)
AST: 15 U/L (ref 15–41)
Albumin: 3 g/dL — ABNORMAL LOW (ref 3.5–5.0)
Alkaline Phosphatase: 47 U/L (ref 38–126)
Anion gap: 10 (ref 5–15)
BUN: 6 mg/dL (ref 6–20)
CO2: 21 mmol/L — ABNORMAL LOW (ref 22–32)
Calcium: 9.3 mg/dL (ref 8.9–10.3)
Chloride: 107 mmol/L (ref 98–111)
Creatinine, Ser: 0.55 mg/dL (ref 0.44–1.00)
GFR, Estimated: >60 mL/min (ref >60)
Glucose, Bld: 89 mg/dL (ref 70–99)
Potassium: 3.9 mmol/L (ref 3.5–5.1)
Sodium: 138 mmol/L (ref 135–145)
Total Bilirubin: 0.4 mg/dL (ref 0.3–1.2)
Total Protein: 6.1 g/dL — ABNORMAL LOW (ref 6.5–8.1)

## 2020-11-17 LAB — CBC
HCT: 34.2 % — ABNORMAL LOW (ref 36.0–46.0)
Hemoglobin: 11.8 g/dL — ABNORMAL LOW (ref 12.0–15.0)
MCH: 30.9 pg (ref 26.0–34.0)
MCHC: 34.5 g/dL (ref 30.0–36.0)
MCV: 89.5 fL (ref 80.0–100.0)
Platelets: 277 10*3/uL (ref 150–400)
RBC: 3.82 MIL/uL — ABNORMAL LOW (ref 3.87–5.11)
RDW: 13.5 % (ref 11.5–15.5)
WBC: 9.8 10*3/uL (ref 4.0–10.5)
nRBC: 0 % (ref 0.0–0.2)

## 2020-11-17 LAB — TYPE AND SCREEN
ABO/RH(D): O POS
Antibody Screen: NEGATIVE

## 2020-11-17 LAB — FETAL FIBRONECTIN: Fetal Fibronectin: NEGATIVE

## 2020-11-17 MED ORDER — LACTATED RINGERS IV BOLUS
1000.0000 mL | Freq: Once | INTRAVENOUS | Status: AC
  Administered 2020-11-17: 1000 mL via INTRAVENOUS

## 2020-11-17 NOTE — MAU Provider Note (Addendum)
Chief Complaint:  Abdominal Pain   Event Date/Time   First Provider Initiated Contact with Patient 11/17/20 0727     HPI: Jordan Blair is a 30 y.o. [id number] at 92w6dwho presents to maternity admissions reporting intermittent sharp RIght sided pain since 4am.   Comes and goes.  Middle to right lower abdomen with some radiation around flank. . She reports good fetal movement, denies LOF, vaginal bleeding, vaginal itching/burning, urinary symptoms, h/a, dizziness, n/v, diarrhea, constipation or fever/chills.    History is remarkable for two preterm birth, both initiated by PPROM   Just recently started Makena injections (delayed by insurance issues).   Cervical length on 11/12/20 was 3.2cm  Abdominal Pain This is a new problem. The current episode started today. The problem occurs intermittently. The problem has been unchanged. The pain is located in the RLQ and right flank. The quality of the pain is cramping and sharp. The abdominal pain radiates to the right flank. Associated symptoms include constipation. Pertinent negatives include no diarrhea, dysuria, fever, frequency, headaches or myalgias. Nothing aggravates the pain. The pain is relieved by nothing. She has tried nothing for the symptoms.   RN Note: Patient started with sharp right lower abdominal pain around 0400, reports the pain is constant.  Denies ever feeling this pain before.  Denies any LOF/VB.  Reports + FM. Uncertain whether she's felt any recent contractions.  Hx preterm delivery-PPROM at 29 weeks  Past Medical History: Past Medical History:  Diagnosis Date  . Seizures (HCC)     Past obstetric history: OB History  Gravida Para Term Preterm AB Living  3 2   2   2   SAB IAB Ectopic Multiple Live Births          2    # Outcome Date GA Lbr Len/2nd Weight Sex Delivery Anes PTL Lv  3 Current           2 Preterm 2014 [redacted]w[redacted]d   F Vag-Spont EPI Y LIV  1 Preterm 2012 [redacted]w[redacted]d   F Vag-Spont EPI Y LIV    Past Surgical  History: Past Surgical History:  Procedure Laterality Date  . INDUCED ABORTION      Family History: Family History  Problem Relation Age of Onset  . Anxiety disorder Maternal Grandmother   . Arthritis Maternal Grandmother   . Asthma Maternal Grandmother   . Hypertension Maternal Grandmother     Social History: Social History   Tobacco Use  . Smoking status: Former Smoker    Packs/day: 0.50  . Smokeless tobacco: Never Used  Vaping Use  . Vaping Use: Never used  Substance Use Topics  . Alcohol use: Not Currently    Comment: social  . Drug use: Not Currently    Types: Marijuana    Allergies:  Allergies  Allergen Reactions  . Amoxicillin     Other reaction(s): GI Upset (intolerance)    Meds:  Facility-Administered Medications Prior to Admission  Medication Dose Route Frequency Provider Last Rate Last Admin  . HYDROXYprogesterone caproate (Makena) autoinjector 275 mg  275 mg Subcutaneous Weekly [redacted]w[redacted]d, DO   275 mg at 11/11/20 01/09/21   Medications Prior to Admission  Medication Sig Dispense Refill Last Dose  . diphenhydramine-acetaminophen (TYLENOL PM) 25-500 MG TABS tablet Take 1 tablet by mouth at bedtime as needed.   11/16/2020 at Unknown time  . folic acid (FOLVITE) 1 MG tablet Take 1 tablet (1 mg total) by mouth daily. (Patient not taking: Reported on 11/12/2020) 30 tablet 10   .  levETIRAcetam (KEPPRA) 750 MG tablet Take 1 tablet (750 mg total) by mouth 2 (two) times daily. (Patient not taking: Reported on 11/12/2020) 60 tablet 3   . ondansetron (ZOFRAN ODT) 4 MG disintegrating tablet Take 1 tablet (4 mg total) by mouth every 6 (six) hours as needed for nausea. (Patient not taking: No sig reported) 30 tablet 6   . Prenatal Vit-Fe Fumarate-FA (PRENATAL VITAMINS PO) Take by mouth. (Patient not taking: No sig reported)       I have reviewed patient's Past Medical Hx, Surgical Hx, Family Hx, Social Hx, medications and allergies.   ROS:  Review of Systems   Constitutional: Negative for fever.  Gastrointestinal: Positive for abdominal pain and constipation. Negative for diarrhea.  Genitourinary: Negative for dysuria and frequency.  Musculoskeletal: Negative for myalgias.  Neurological: Negative for headaches.   Other systems negative  Physical Exam   Vitals:   11/17/20 0721 11/17/20 0729  BP: (!) 83/42 107/61  Pulse: 89 81  Resp: 18   Temp: 98.6 F (37 C)   TempSrc: Oral   SpO2: 100%     Constitutional: Well-developed, well-nourished female in no acute distress.  Cardiovascular: normal rate and rhythm Respiratory: normal effort, clear to auscultation bilaterally GI: Abd soft, somewhat tender over RLQ, abdomen is gravid and appropriate for gestational age.   No rebound or guarding. MS: Extremities nontender, no edema, normal ROM Neurologic: Alert and oriented x 4.  GU: Neg CVAT.  PELVIC EXAM:  Dilation: 1 Effacement (%): Thick Station: Ballotable Exam by:: Wynelle Bourgeois, CNMCervix 1cm / Long / Ballotable / Difficult to ascertain presentation  FHT:  Baseline 140 , moderate variability, accelerations present, One deep variable deceleration during assessment to 70s lasting about 30 seconds, responsive to position change.  Contractions: Toco not tracing well, apparent uterine irritability at times   Labs:  --/--/O POS (02/09 4496) Results for orders placed or performed during the hospital encounter of 11/17/20 (from the past 24 hour(s))  Fetal fibronectin     Status: None   Collection Time: 11/17/20  7:33 AM  Result Value Ref Range   Fetal Fibronectin NEGATIVE NEGATIVE  CBC     Status: Abnormal   Collection Time: 11/17/20  7:43 AM  Result Value Ref Range   WBC 9.8 4.0 - 10.5 K/uL   RBC 3.82 (L) 3.87 - 5.11 MIL/uL   Hemoglobin 11.8 (L) 12.0 - 15.0 g/dL   HCT 75.9 (L) 16.3 - 84.6 %   MCV 89.5 80.0 - 100.0 fL   MCH 30.9 26.0 - 34.0 pg   MCHC 34.5 30.0 - 36.0 g/dL   RDW 65.9 93.5 - 70.1 %   Platelets 277 150 - 400 K/uL    nRBC 0.0 0.0 - 0.2 %  Comprehensive metabolic panel     Status: Abnormal   Collection Time: 11/17/20  7:43 AM  Result Value Ref Range   Sodium 138 135 - 145 mmol/L   Potassium 3.9 3.5 - 5.1 mmol/L   Chloride 107 98 - 111 mmol/L   CO2 21 (L) 22 - 32 mmol/L   Glucose, Bld 89 70 - 99 mg/dL   BUN 6 6 - 20 mg/dL   Creatinine, Ser 7.79 0.44 - 1.00 mg/dL   Calcium 9.3 8.9 - 39.0 mg/dL   Total Protein 6.1 (L) 6.5 - 8.1 g/dL   Albumin 3.0 (L) 3.5 - 5.0 g/dL   AST 15 15 - 41 U/L   ALT 9 0 - 44 U/L   Alkaline Phosphatase 47  38 - 126 U/L   Total Bilirubin 0.4 0.3 - 1.2 mg/dL   GFR, Estimated >44 >01 mL/min   Anion gap 10 5 - 15  Type and screen     Status: None   Collection Time: 11/17/20  7:43 AM  Result Value Ref Range   ABO/RH(D) O POS    Antibody Screen NEG    Sample Expiration      11/20/2020,2359 Performed at [facility], [address] [place], [place] [address]    Imaging:  US Abdomen Complete  Result Date: 11/17/2020 CLINICAL DATA:  30 year old female with right lower quadrant pain in the 2nd trimester of pregnancy. EXAM: ABDOMEN ULTRASOUND COMPLETE COMPARISON:  CT Abdomen and Pelvis [facility] 08/14/2018. FINDINGS: Gallbladder: No gallstones or wall thickening visualized. No sonographic Murphy sign noted by sonographer. Common bile duct: Diameter: 5-6 mm, normal. Liver: No focal lesion identified. Within normal limits in parenchymal echogenicity. Portal vein is patent on color Doppler imaging with normal direction of blood flow towards the liver. IVC: No abnormality visualized. Pancreas: Visualized portion unremarkable. Spleen: Size and appearance within normal limits. Right Kidney: Length: 10.3 cm. Echogenicity within normal limits. No mass or hydronephrosis visualized. Left Kidney: Length: 10.5 cm. Echogenicity within normal limits. No mass or hydronephrosis visualized. Abdominal aorta: No aneurysm visualized. Other findings: None.  IMPRESSION: Normal ultrasound appearance of the abdomen. Electronically Signed   By: Odessa Fleming M.D.   On: 11/17/2020 09:16   Preliminary report for MFM OB Limited is normal.  MAU Course/MDM: I have ordered Fetal Fibronectin and bloodwork to assess for clues to etiology of pain.   She states she had a BM yesterday but it was hard and she needed to strain.  Unclear that this alone is the source of pain.  Pain comes and goes, I am suspicious it is preterm contractions that we are not detecting, or possibly UTI vs stone, given colicky nature of pain.  Treatments in MAU included IV hydration.  Fetal fibronectin sent.  Consider tocolytic once tracing becomes more consistent. .    Report given to Oncoming Provider  Wynelle Bourgeois CNM, MSN Certified Nurse-Midwife 11/17/2020 8:59 AM   Assumed care at 0900. Pt still reporting RUQ/RLQ pain with tenderness in RUQ. Able to give urine sample after LR bolus. Reports pain is somewhat decreased with Tylenol PM she took last night, but still tender and guarding somewhat. Abdominal & limited U/S ordered - both normal.  After pressing on abdomen and pt getting up to move around, pt stated pain is relieved. Suspect gas pain given hx of recent constipation.  Assessment: Single Intrauterine pregnancy at [redacted]w[redacted]d Abdominal pain in the second trimester Gas pain  Plan Discharge home in stable condition with preterm labor precautions Follow up at [facility] as scheduled for on-going prenatal care  Allergies as of 11/17/2020      Reactions   Amoxicillin    Other reaction(s): GI Upset (intolerance)      Medication List    STOP taking these medications   ondansetron 4 MG disintegrating tablet Commonly known as: Zofran ODT     TAKE these medications   diphenhydramine-acetaminophen 25-500 MG Tabs tablet Commonly known as: TYLENOL PM Take 1 tablet by mouth at bedtime as needed.   folic acid 1 MG tablet Commonly known as: FOLVITE Take 1 tablet (1 mg total) by  mouth daily.   levETIRAcetam 750 MG tablet Commonly known as: KEPPRA Take 1 tablet (750 mg total)  by mouth 2 (two) times daily.   PRENATAL VITAMINS PO Take by mouth.      Edd Arbour, CNM, MSN, IBCLC Certified Nurse Midwife, [facility]

## 2020-11-17 NOTE — MAU Note (Signed)
Patient started with sharp right lower abdominal pain around 0400, reports the pain is constant.  Denies ever feeling this pain before.  Denies any LOF/VB.  Reports + FM.  Uncertain whether she's felt any recent contractions.  Hx preterm delivery-PPROM at 29 weeks.

## 2020-11-17 NOTE — Discharge Instructions (Signed)
Abdominal Bloating When you have abdominal bloating, your abdomen may feel full, tight, or painful. It may also look bigger than normal or swollen (distended). Common causes of abdominal bloating include: Swallowing air. Constipation. Problems digesting food. Eating too much. Irritable bowel syndrome. This is a condition that affects the large intestine. Lactose intolerance. This is an inability to digest lactose, a natural sugar in dairy products. Celiac disease. This is a condition that affects the ability to digest gluten, a protein found in some grains. Gastroparesis. This is a condition that slows down the movement of food in the stomach and small intestine. It is more common in people with diabetes mellitus. Gastroesophageal reflux disease (GERD). This is a digestive condition that makes stomach acid flow back into the esophagus. Urinary retention. This means that the body is holding onto urine, and the bladder cannot be emptied all the way. Follow these instructions at home: Eating and drinking Avoid eating too much. Try not to swallow air while talking or eating. Avoid eating while lying down. Avoid these foods and drinks: Foods that cause gas, such as broccoli, cabbage, cauliflower, and baked beans. Carbonated drinks. Hard candy. Chewing gum. Medicines Take over-the-counter and prescription medicines only as told by your health care provider. Take probiotic medicines. These medicines contain live bacteria or yeasts that can help digestion. Take coated peppermint oil capsules. Activity Try to exercise regularly. Exercise may help to relieve bloating that is caused by gas and relieve constipation. General instructions Keep all follow-up visits as told by your health care provider. This is important. Contact a health care provider if: You have nausea and vomiting. You have diarrhea. You have abdominal pain. You have unusual weight loss or weight gain. You have severe pain,  and medicines do not help. Get help right away if: You have severe chest pain. You have trouble breathing. You have shortness of breath. You have trouble urinating. You have darker urine than normal. You have blood in your stools or have dark, tarry stools. Summary Abdominal bloating means that the abdomen is swollen. Common causes of abdominal bloating are swallowing air, constipation, and problems digesting food. Avoid eating too much and avoid swallowing air. Avoid foods that cause gas, carbonated drinks, hard candy, and chewing gum. This information is not intended to replace advice given to you by your health care provider. Make sure you discuss any questions you have with your health care provider. Document Revised: 01/13/2019 Document Reviewed: 10/27/2016 Elsevier Patient Education  2021 Elsevier Inc.  

## 2020-11-18 ENCOUNTER — Ambulatory Visit (INDEPENDENT_AMBULATORY_CARE_PROVIDER_SITE_OTHER): Payer: Medicaid Other

## 2020-11-18 ENCOUNTER — Other Ambulatory Visit: Payer: Self-pay

## 2020-11-18 VITALS — BP 92/61 | HR 94

## 2020-11-18 DIAGNOSIS — Z8751 Personal history of pre-term labor: Secondary | ICD-10-CM

## 2020-11-18 DIAGNOSIS — O09899 Supervision of other high risk pregnancies, unspecified trimester: Secondary | ICD-10-CM

## 2020-11-18 NOTE — Progress Notes (Addendum)
Patient received her 17 P - supplied by her. Armandina Stammer RN  After injection patient was speaking with Melvia Heaps, RN  (pregnancy care coordinator) and got hot and sweaty and felt like she was going to pass out. Patient given cool cloth and vital taken. After 5 minutes of evaluation patient states she needed to go. Patient states she was feeling better. Patient made aware she should probably wait a little longer for monitoring but patient insists she is ready to go home.  Melvia Heaps, RN offered to walk patient to the car. Patient states she will call back for her next appointment. Armandina Stammer RN   Attestation of Attending Supervision of RN: Evaluation and management procedures were performed by the nurse under my supervision and collaboration.  I have reviewed the nursing note and chart, and I agree with the management and plan.  Carolyn L. Harraway-Smith, M.D., Evern Core

## 2020-11-22 ENCOUNTER — Telehealth: Payer: Self-pay

## 2020-11-22 NOTE — Telephone Encounter (Signed)
Pt called stating she developed some swelling and a knot on her left arm after receiving the Makena injection. Pt states she has applied ice over the weekend and the swelling has gone down but there is a knot on her arm. Advised pt to continue applying ice to the injection site and we will discuss symptoms at her next visit. Understanding was voiced. Meela Wareing l Daray Polgar, CMA

## 2020-11-23 ENCOUNTER — Encounter: Payer: Self-pay | Admitting: General Practice

## 2020-11-25 ENCOUNTER — Ambulatory Visit: Payer: Medicaid Other

## 2020-12-02 ENCOUNTER — Ambulatory Visit: Payer: Self-pay | Admitting: Neurology

## 2020-12-03 ENCOUNTER — Encounter: Payer: 59 | Admitting: Obstetrics & Gynecology

## 2020-12-07 ENCOUNTER — Ambulatory Visit: Payer: Self-pay | Admitting: Neurology

## 2020-12-09 ENCOUNTER — Other Ambulatory Visit: Payer: Self-pay

## 2020-12-09 ENCOUNTER — Ambulatory Visit (INDEPENDENT_AMBULATORY_CARE_PROVIDER_SITE_OTHER): Payer: Medicaid Other | Admitting: Family Medicine

## 2020-12-09 VITALS — BP 99/61 | HR 80 | Wt 166.0 lb

## 2020-12-09 DIAGNOSIS — Z3A29 29 weeks gestation of pregnancy: Secondary | ICD-10-CM

## 2020-12-09 DIAGNOSIS — D069 Carcinoma in situ of cervix, unspecified: Secondary | ICD-10-CM

## 2020-12-09 DIAGNOSIS — G40909 Epilepsy, unspecified, not intractable, without status epilepticus: Secondary | ICD-10-CM

## 2020-12-09 DIAGNOSIS — O99352 Diseases of the nervous system complicating pregnancy, second trimester: Secondary | ICD-10-CM

## 2020-12-09 DIAGNOSIS — O09899 Supervision of other high risk pregnancies, unspecified trimester: Secondary | ICD-10-CM

## 2020-12-09 DIAGNOSIS — F439 Reaction to severe stress, unspecified: Secondary | ICD-10-CM

## 2020-12-09 NOTE — Progress Notes (Signed)
   PRENATAL VISIT NOTE  Subjective:  Jordan Blair is a 30 y.o. N2T5573 at [redacted]w[redacted]d being seen today for ongoing prenatal care.  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  Patient reports no complaints.  Contractions: Not present. Vag. Bleeding: None.  Movement: Present. Denies leaking of fluid.   The following portions of the patient's history were reviewed and updated as appropriate: allergies, current medications, past family history, past medical history, past social history, past surgical history and problem list.   Objective:   Vitals:   12/09/20 1359  BP: 99/61  Pulse: 80  Weight: 166 lb (75.3 kg)    Fetal Status: Fetal Heart Rate (bpm): 160   Movement: Present     General:  Alert, oriented and cooperative. Patient is in no acute distress.  Skin: Skin is warm and dry. No rash noted.   Cardiovascular: Normal heart rate noted  Respiratory: Normal respiratory effort, no problems with respiration noted  Abdomen: Soft, gravid, appropriate for gestational age.  Pain/Pressure: Present     Pelvic: Cervical exam deferred        Extremities: Normal range of motion.  Edema: Trace  Mental Status: Normal mood and affect. Normal behavior. Normal judgment and thought content.    Assessment and Plan:  Pregnancy: [id number] at [redacted]w[redacted]d 1. [redacted] weeks gestation of pregnancy  2. Supervision of other high risk pregnancy, antepartum FHT and FH normal - Ambulatory referral to [facility] - [place] MFM OB FOLLOW UP; Future  3. Stress - Ambulatory referral to Integrated Behavioral [facility]  4. Hx of preterm delivery, currently pregnant Declines further doses of makena  5. Seizure disorder  during pregnancy in second trimester (HCC) No breakthrough seizures - [place] MFM OB FOLLOW UP; Future  6. Severe dysplasia of cervix (CIN III) See colpo note.  Preterm labor symptoms and general obstetric precautions including but not limited to vaginal bleeding, contractions, leaking of fluid and fetal movement were reviewed in detail with the patient. Please refer to After Visit Summary for other counseling recommendations.   No follow-ups on file.  Future Appointments  Date Time Provider Department Center  12/16/2020 10:30 AM [facility] NURSE [facility]  12/16/2020 10:45 AM [facility] US4 [facility]  02/03/2021  4:00 PM York Spaniel, MD [facility] None    Levie Heritage, DO

## 2020-12-09 NOTE — Progress Notes (Signed)
Patient Name: Jordan Blair, female   DOB: 28-Jan-1991, 30 y.o.  MRN: [id number]  Colposcopy Procedure Note:  [id number] Pregnancy status: [redacted]w[redacted]d Indications: HSIL HPV:  Positive Cervical History:  Previous Abnormal Pap:   Previous Colposcopy: CIN3 done earlier this pregnancy  Previous LEEP or Cryo:   Smoking: Former Smoker Hysterectomy: No   Patient given informed consent, signed copy in the chart, time out was performed.    Exam: Vulva and Vagina grossly normal.  Cervix viewed with speculum and colposcope after application of acetic acid:  Cervix Fully Visualized Squamocolumnar Junction Visibility: Fully visualized  Acetowhite lesions: 6 o'clock  Other Lesions: None Punctation: Not present  Mosaicism: Coarse Abnormal vasculature: No   Biopsies: none ECC: No  Hemostasis achieved with:  N/a  Colposcopy Impression:  CIN2-3

## 2020-12-14 ENCOUNTER — Telehealth: Payer: Self-pay | Admitting: Family Medicine

## 2020-12-14 NOTE — Telephone Encounter (Signed)
Patient called to say she did not need this appointment, and wanted to cancel.

## 2020-12-15 ENCOUNTER — Encounter: Payer: Medicaid Other | Admitting: Licensed Clinical Social Worker

## 2020-12-16 ENCOUNTER — Encounter: Payer: Self-pay | Admitting: *Deleted

## 2020-12-16 ENCOUNTER — Ambulatory Visit: Payer: Medicaid Other | Attending: Obstetrics

## 2020-12-16 ENCOUNTER — Other Ambulatory Visit: Payer: Self-pay

## 2020-12-16 ENCOUNTER — Ambulatory Visit: Payer: Medicaid Other | Admitting: *Deleted

## 2020-12-16 DIAGNOSIS — O09899 Supervision of other high risk pregnancies, unspecified trimester: Secondary | ICD-10-CM | POA: Diagnosis present

## 2020-12-16 DIAGNOSIS — G40909 Epilepsy, unspecified, not intractable, without status epilepticus: Secondary | ICD-10-CM | POA: Diagnosis present

## 2020-12-16 DIAGNOSIS — O99352 Diseases of the nervous system complicating pregnancy, second trimester: Secondary | ICD-10-CM | POA: Insufficient documentation

## 2020-12-18 ENCOUNTER — Inpatient Hospital Stay (HOSPITAL_COMMUNITY)
Admission: AD | Admit: 2020-12-18 | Discharge: 2020-12-19 | Disposition: A | Discharge status: Home / Self Care | Payer: Medicaid Other | Attending: Obstetrics & Gynecology | Admitting: Obstetrics & Gynecology

## 2020-12-18 ENCOUNTER — Encounter (HOSPITAL_COMMUNITY): Payer: Self-pay | Admitting: Obstetrics & Gynecology

## 2020-12-18 ENCOUNTER — Other Ambulatory Visit: Payer: Self-pay

## 2020-12-18 DIAGNOSIS — R1030 Lower abdominal pain, unspecified: Secondary | ICD-10-CM | POA: Insufficient documentation

## 2020-12-18 DIAGNOSIS — Z3A30 30 weeks gestation of pregnancy: Secondary | ICD-10-CM | POA: Insufficient documentation

## 2020-12-18 DIAGNOSIS — O26899 Other specified pregnancy related conditions, unspecified trimester: Secondary | ICD-10-CM

## 2020-12-18 DIAGNOSIS — Z3689 Encounter for other specified antenatal screening: Secondary | ICD-10-CM | POA: Diagnosis not present

## 2020-12-18 DIAGNOSIS — O99613 Diseases of the digestive system complicating pregnancy, third trimester: Secondary | ICD-10-CM | POA: Insufficient documentation

## 2020-12-18 DIAGNOSIS — O09899 Supervision of other high risk pregnancies, unspecified trimester: Secondary | ICD-10-CM

## 2020-12-18 DIAGNOSIS — Z880 Allergy status to penicillin: Secondary | ICD-10-CM | POA: Insufficient documentation

## 2020-12-18 DIAGNOSIS — O4703 False labor before 37 completed weeks of gestation, third trimester: Secondary | ICD-10-CM | POA: Diagnosis not present

## 2020-12-18 DIAGNOSIS — K5900 Constipation, unspecified: Secondary | ICD-10-CM | POA: Insufficient documentation

## 2020-12-18 DIAGNOSIS — Z87891 Personal history of nicotine dependence: Secondary | ICD-10-CM | POA: Diagnosis not present

## 2020-12-18 DIAGNOSIS — O26893 Other specified pregnancy related conditions, third trimester: Secondary | ICD-10-CM | POA: Insufficient documentation

## 2020-12-18 LAB — CBC WITH DIFFERENTIAL/PLATELET
Abs Immature Granulocytes: 0.19 10*3/uL — ABNORMAL HIGH (ref 0.00–0.07)
Basophils Absolute: 0 10*3/uL (ref 0.0–0.1)
Basophils Relative: 0 %
Eosinophils Absolute: 0.3 10*3/uL (ref 0.0–0.5)
Eosinophils Relative: 2 %
HCT: 36 % (ref 36.0–46.0)
Hemoglobin: 12 g/dL (ref 12.0–15.0)
Immature Granulocytes: 2 %
Lymphocytes Relative: 26 %
Lymphs Abs: 3.4 10*3/uL (ref 0.7–4.0)
MCH: 29.8 pg (ref 26.0–34.0)
MCHC: 33.3 g/dL (ref 30.0–36.0)
MCV: 89.3 fL (ref 80.0–100.0)
Monocytes Absolute: 0.9 10*3/uL (ref 0.1–1.0)
Monocytes Relative: 7 %
Neutro Abs: 8.2 10*3/uL — ABNORMAL HIGH (ref 1.7–7.7)
Neutrophils Relative %: 63 %
Platelets: 301 10*3/uL (ref 150–400)
RBC: 4.03 MIL/uL (ref 3.87–5.11)
RDW: 13.2 % (ref 11.5–15.5)
WBC: 12.9 10*3/uL — ABNORMAL HIGH (ref 4.0–10.5)
nRBC: 0 % (ref 0.0–0.2)

## 2020-12-18 LAB — WET PREP, GENITAL
Clue Cells Wet Prep HPF POC: NONE SEEN
Sperm: NONE SEEN
Trich, Wet Prep: NONE SEEN
Yeast Wet Prep HPF POC: NONE SEEN

## 2020-12-18 LAB — BASIC METABOLIC PANEL
Anion gap: 8 (ref 5–15)
BUN: 6 mg/dL (ref 6–20)
CO2: 21 mmol/L — ABNORMAL LOW (ref 22–32)
Calcium: 8.9 mg/dL (ref 8.9–10.3)
Chloride: 101 mmol/L (ref 98–111)
Creatinine, Ser: 0.63 mg/dL (ref 0.44–1.00)
GFR, Estimated: >60 mL/min (ref >60)
Glucose, Bld: 129 mg/dL — ABNORMAL HIGH (ref 70–99)
Potassium: 3.4 mmol/L — ABNORMAL LOW (ref 3.5–5.1)
Sodium: 130 mmol/L — ABNORMAL LOW (ref 135–145)

## 2020-12-18 LAB — URINALYSIS, ROUTINE W REFLEX MICROSCOPIC
Bilirubin Urine: NEGATIVE
Glucose, UA: NEGATIVE mg/dL
Hgb urine dipstick: NEGATIVE
Ketones, ur: 5 mg/dL — AB
Nitrite: NEGATIVE
Protein, ur: 30 mg/dL — AB
Specific Gravity, Urine: 1.029 (ref 1.005–1.030)
pH: 6 (ref 5.0–8.0)

## 2020-12-18 MED ORDER — LACTATED RINGERS IV BOLUS
1000.0000 mL | Freq: Once | INTRAVENOUS | Status: AC
  Administered 2020-12-18: 1000 mL via INTRAVENOUS

## 2020-12-18 MED ORDER — CYCLOBENZAPRINE HCL 5 MG PO TABS
10.0000 mg | ORAL_TABLET | Freq: Once | ORAL | Status: AC
  Administered 2020-12-18: 10 mg via ORAL
  Filled 2020-12-18: qty 2

## 2020-12-18 NOTE — MAU Provider Note (Addendum)
Jordan Blair is a 30 y.o. female [phone] with IUP at [redacted]w[redacted]d by LMP presenting for contractions since this morning, started about 1:00/2:00am. Patient rates pain eight (8) out of ten (10). Vomited x 1 this morning, denies anymore nausea or vomiting at this time. Patient states that she is constipated, last BM day before yesterday, stated she feels like I need to have a BM but nothing comes out.  Patient stated that she took tylenol an hour ago.  She reports +fetal movement.   Denies difficulty breathing, respiratory distress, chest pain, vaginal bleeding, headaches or blurred vision, and leg pain or swelling.  She received her prenatal care at [facility]    Dating: By LMP --->  Estimated Date of Delivery: 02/24/21  Sono: @[redacted]w[redacted]d , CWD, normal anatomy, breech presentation, 54% EFW   Past Medical History: Past Medical History:  Diagnosis Date  . Seizures (HCC)     Past Surgical History: Past Surgical History:  Procedure Laterality Date  . INDUCED ABORTION      Obstetrical History: OB History    Gravida  3   Para  2   Term      Preterm  2   AB      Living  2     SAB      IAB      Ectopic      Multiple      Live Births  2           Social History Social History   Socioeconomic History  . Marital status: Single    Spouse name: Not on file  . Number of children: Not on file  . Years of education: Not on file  . Highest education level: Not on file  Occupational History  . Not on file  Tobacco Use  . Smoking status: Former Smoker    Packs/day: 0.50  . Smokeless tobacco: Never Used  Vaping Use  . Vaping Use: Never used  Substance and Sexual Activity  . Alcohol use: Not Currently    Comment: social  . Drug use: Not Currently    Types: Marijuana  . Sexual activity: Not Currently    Birth control/protection: None  Other Topics Concern  . Not on file  Social History Narrative  . Not on file   Social Determinants of Health   Financial Resource  Strain: Not on file  Food Insecurity: Not on file  Transportation Needs: Not on file  Physical Activity: Not on file  Stress: Not on file  Social Connections: Not on file    Family History: Family History  Problem Relation Age of Onset  . Anxiety disorder Maternal Grandmother   . Arthritis Maternal Grandmother   . Asthma Maternal Grandmother   . Hypertension Maternal Grandmother     Allergies: Allergies  Allergen Reactions  . Amoxicillin     Other reaction(s): GI Upset (intolerance)    Facility-Administered Medications Prior to Admission  Medication Dose Route Frequency Provider Last Rate Last Admin  . HYDROXYprogesterone caproate (Makena) autoinjector 275 mg  275 mg Subcutaneous Weekly , DO   275 mg at 11/18/20 01/16/21   Medications Prior to Admission  Medication Sig Dispense Refill Last Dose  . Prenatal Vit-Fe Fumarate-FA (PRENATAL VITAMINS PO) Take by mouth.   12/18/2020 at Unknown time  . diphenhydramine-acetaminophen (TYLENOL PM) 25-500 MG TABS tablet Take 1 tablet by mouth at bedtime as needed. (Patient not taking: No sig reported)   More than a month at Unknown time  .  folic acid (FOLVITE) 1 MG tablet Take 1 tablet (1 mg total) by mouth daily. 30 tablet 10 More than a month at Unknown time  . levETIRAcetam (KEPPRA) 750 MG tablet Take 1 tablet (750 mg total) by mouth 2 (two) times daily. (Patient not taking: No sig reported) 60 tablet 3 More than a month at Unknown time     Review of Systems   All systems reviewed and negative except as stated in HPI  Objective:   Blood pressure 111/70, pulse (!) 103, resp. rate 18, last menstrual period 05/20/2020. General appearance: alert, cooperative and mild distress Pelvic: Cervical exam done by CNM Extremities: Homans sign is negative, no sign of DVT Presentation: unsure Fetal monitoringBaseline: 155 bpm, Variability: Good {> 6 bpm), Accelerations: Reactive and Decelerations: Variable: mild Uterine  activityDate/time of onset: today (3/12) @ 1/2:00am, Frequency: Every 110-180 minutes, Duration: 10-50 seconds and Intensity: mild  Dilation: Fingertip Effacement (%): Thick Exam by:: Gerrit Heck, CNM   Results for orders placed or performed during the hospital encounter of 12/18/20 (from the past 24 hour(s))  Wet prep, genital   Collection Time: 12/18/20 11:05 PM   Specimen: PATH Cytology Cervicovaginal Ancillary Only  Result Value Ref Range   Yeast Wet Prep HPF POC NONE SEEN NONE SEEN   Trich, Wet Prep NONE SEEN NONE SEEN   Clue Cells Wet Prep HPF POC NONE SEEN NONE SEEN   WBC, Wet Prep HPF POC FEW (A) NONE SEEN   Sperm NONE SEEN   Urinalysis, Routine w reflex microscopic PATH Cytology Cervicovaginal Ancillary Only   Collection Time: 12/18/20 11:05 PM  Result Value Ref Range   Color, Urine YELLOW YELLOW   APPearance CLOUDY (A) CLEAR   Specific Gravity, Urine 1.029 1.005 - 1.030   pH 6.0 5.0 - 8.0   Glucose, UA NEGATIVE NEGATIVE mg/dL   Hgb urine dipstick NEGATIVE NEGATIVE   Bilirubin Urine NEGATIVE NEGATIVE   Ketones, ur 5 (A) NEGATIVE mg/dL   Protein, ur 30 (A) NEGATIVE mg/dL   Nitrite NEGATIVE NEGATIVE   Leukocytes,Ua LARGE (A) NEGATIVE   RBC / HPF 0-5 0 - 5 RBC/hpf   WBC, UA 11-20 0 - 5 WBC/hpf   Bacteria, UA RARE (A) NONE SEEN   Squamous Epithelial / LPF 21-50 0 - 5   Mucus PRESENT   CBC with Differential/Platelet   Collection Time: 12/18/20 11:05 PM  Result Value Ref Range   WBC 12.9 (H) 4.0 - 10.5 K/uL   RBC 4.03 3.87 - 5.11 MIL/uL   Hemoglobin 12.0 12.0 - 15.0 g/dL   HCT 62.9 52.8 - 41.3 %   MCV 89.3 80.0 - 100.0 fL   MCH 29.8 26.0 - 34.0 pg   MCHC 33.3 30.0 - 36.0 g/dL   RDW 24.4 01.0 - 27.2 %   Platelets 301 150 - 400 K/uL   nRBC 0.0 0.0 - 0.2 %   Neutrophils Relative % 63 %   Neutro Abs 8.2 (H) 1.7 - 7.7 K/uL   Lymphocytes Relative 26 %   Lymphs Abs 3.4 0.7 - 4.0 K/uL   Monocytes Relative 7 %   Monocytes Absolute 0.9 0.1 - 1.0 K/uL   Eosinophils  Relative 2 %   Eosinophils Absolute 0.3 0.0 - 0.5 K/uL   Basophils Relative 0 %   Basophils Absolute 0.0 0.0 - 0.1 K/uL   Immature Granulocytes 2 %   Abs Immature Granulocytes 0.19 (H) 0.00 - 0.07 K/uL  Basic metabolic panel   Collection Time: 12/18/20 11:05 PM  Result Value Ref Range   Sodium 130 (L) 135 - 145 mmol/L   Potassium 3.4 (L) 3.5 - 5.1 mmol/L   Chloride 101 98 - 111 mmol/L   CO2 21 (L) 22 - 32 mmol/L   Glucose, Bld 129 (H) 70 - 99 mg/dL   BUN 6 6 - 20 mg/dL   Creatinine, Ser 0.16 0.44 - 1.00 mg/dL   Calcium 8.9 8.9 - 01.0 mg/dL   GFR, Estimated >93 >23 mL/min   Anion gap 8 5 - 15    Assessment/Plan:  Jordan Blair is a 30 y.o. [id number] at [redacted]w[redacted]d here for contractions since early this morning (1:00/2:00am).  IV fluid bolus   Flexeril ordered  Continue to monitor and attempt to stop contractions  GC/CH swab and Wet Prep labs today collected by nurse  Anticipate discharge once patient is without contractions and no cervical progress.  Juliann Pares, Student-MidWife  [employer] 12/18/2020, 10:56 PM  Attestation of Supervision of Student:  I confirm that I have verified the information documented in the nurse midwife student's note and that I have also personally performed the history, physical exam and all medical decision making activities.  I have verified that all services and findings are accurately documented in this student's note; and I agree with management and plan as outlined in the documentation. I have also made any necessary editorial changes.  Patient reports relief with interventions as above. FHT remains reassuring and NST reactive. Labs: CBC/D, BMP, UA, UC Wet prep returns negative and patient discharged to home with flexeril for home usage. Encouraged to utilize as needed.  Cherre Robins, CNM [facility] [employer], [facility] 12/19/2020 3:48 AM

## 2020-12-18 NOTE — MAU Note (Signed)
Pt stated she started having pain in her pelvis and back last night. They "keep coming"  Feels like she nees to have a BM but nothing comes out. Good fetal movement felt. Denies any vag bleeding reports some mucusy discharge.

## 2020-12-19 MED ORDER — CYCLOBENZAPRINE HCL 10 MG PO TABS: 10.0000 mg | ORAL_TABLET | Freq: Two times a day (BID) | ORAL | 0 refills | Status: DC | PRN

## 2020-12-19 NOTE — Discharge Instructions (Signed)
Abdominal Pain During Pregnancy Abdominal pain is common during pregnancy and has many possible causes. Some causes are more serious than others, and sometimes the cause is not known. Abdominal pain can be a sign that labor is starting. It can also be caused by normal growth of your baby causing stretching of muscles and ligaments during pregnancy. Always tell your health care provider if you have any abdominal pain. Follow these instructions at home:  Do not have sex or put anything in your vagina until your pain goes away completely.  Get plenty of rest until your pain improves.  Drink enough fluid to keep your urine pale yellow.  Take over-the-counter and prescription medicines only as told by your health care provider.  Keep all follow-up visits. This is important.   Contact a health care provider if:  Your pain continues or gets worse after resting.  You have lower abdominal pain that: ? Comes and goes at regular intervals. ? Spreads to your back. ? Is similar to menstrual cramps.  You have pain or burning when you urinate. Get help right away if:  You have a fever, chills, or shortness of breath.  You have vaginal bleeding.  You are leaking fluid or passing tissue from your vagina.  You have vomiting or diarrhea that lasts for more than 24 hours.  Your baby is moving less than usual.  You feel very weak or faint.  You develop severe pain in your upper abdomen. Summary  Abdominal pain is common during pregnancy and has many possible causes.  If you experience abdominal pain during pregnancy, tell your health care provider right away.  Follow your health care provider's home care instructions and keep all follow-up visits as told. This information is not intended to replace advice given to you by your health care provider. Make sure you discuss any questions you have with your health care provider. Document Revised: 06/08/2020 Document Reviewed: 06/08/2020 Elsevier  Patient Education  2021 Elsevier Inc.  Preventing Preterm Birth Preterm birth is when a baby is delivered between 20 weeks and 37 weeks of pregnancy. A full-term pregnancy lasts for at least 37 weeks. Preterm birth can be dangerous for your baby because the last few weeks of pregnancy are an important time for your baby to grow and reach a normal birth weight. How can preterm birth affect my baby? Complications of preterm birth may include:  Breathing problems.  Brain damage that affects movement and coordination (cerebral palsy).  Trouble with feeding.  Problems with vision or hearing.  Infections or inflammation of the digestive tract (colitis).  Developmental delays and learning disabilities.  Low birth weight or very low birth weight.  Higher risk for diabetes, heart disease, and high blood pressure later in life. What can increase my risk of having a preterm birth? The exact cause of preterm birth is unknown. The following factors make you more likely to have a preterm birth:  Being diagnosed with placenta previa. This is a condition in which the placenta covers the lowest part of your uterus (cervix), which opens into the vagina.  Certain conditions of your current and past pregnancies, such as: ? Having had a preterm birth before. ? Being pregnant with multiples. ? Waiting less than 6 months between giving birth and becoming pregnant again. ? Certain abnormalities in your unborn baby. ? Vaginal bleeding during pregnancy. ? Becoming pregnant through in vitro fertilization (IVF).  Being overweight or underweight.  Medical history of: ? STIs (sexually transmitted infections) or  other infections of the urinary tract and the vagina. ? Long-term (chronic) illnesses, such as blood clotting problems, diabetes, or high blood pressure. ? Short cervix.  Lifestyle and environmental factors, such as: ? Using tobacco products or drugs. ? Drinking alcohol. ? Having stress and no  social support. ? Violence in the home (domestic violence). ? Being exposed to certain chemicals or pollutants in the environment. What actions can I take to prevent preterm birth? Medical care The most important thing you can do to lower your risk for preterm birth is to get routine medical care during pregnancy (prenatal care). Keep all follow-up visits as told by your health care provider. This is important.  If you have a high risk of preterm birth:  You may be referred to a health care provider who specializes in managing high-risk pregnancies (perinatologist).  You may be given medicine to help prevent preterm birth. Lifestyle Certain lifestyle changes can also lower your risk of preterm birth:  Wait at least 6 months after a pregnancy to become pregnant again.  Get to a healthy weight before getting pregnant. If you are overweight, work with your health care provider to safely lose weight.  Do not use any products that contain nicotine or tobacco, such as cigarettes, e-cigarettes, and chewing tobacco. If you need help quitting, ask your health care provider.  Do not drink alcohol.  Do not use drugs.  Eat a healthy diet.  Manage other medical problems, such as diabetes or high blood pressure.   Where to find support For more support, consider:  Talking with your health care provider.  Talking with a therapist or substance abuse counselor, if you need help quitting.  Working with a Data processing manager or a Systems analyst to maintain a healthy weight.  Joining a support group. Where to find more information Learn more about preventing preterm birth from:  Centers for Disease Control and Prevention: TonerPromos.no  March of Dimes: marchofdimes.org  American Pregnancy Association: americanpregnancy.org Contact a health care provider if: You have any of the following signs or symptoms of preterm labor before 37 weeks:  A change or increase in vaginal discharge.  Fluid leaking from  your vagina.  Pressure or cramps in your lower abdomen.  A backache that does not go away or gets worse.  Regular tightening (contractions) in your lower abdomen. Get help right away if:  You are having regular painful contractions every 5 minutes or less.  Your water breaks. Summary  Preterm birth means having your baby during weeks 20-37 of pregnancy.  Preterm birth may put your baby at risk for physical and mental problems.  The exact cause of preterm birth is unknown. However, being diagnosed with placenta previa or having vaginal bleeding or an STI (sexually transmitted infection) increases your risk for preterm birth.  Getting good prenatal care can help prevent preterm birth. Keep all follow-up visits as told by your health care provider. This is important.  Contact a health care provider if you have signs or symptoms of preterm labor. This information is not intended to replace advice given to you by your health care provider. Make sure you discuss any questions you have with your health care provider. Document Revised: 09/01/2019 Document Reviewed: 09/01/2019 Elsevier Patient Education  2021 [employer].

## 2020-12-20 LAB — GC/CHLAMYDIA PROBE AMP (~~LOC~~) NOT AT ARMC
Chlamydia: NEGATIVE
Comment: NEGATIVE
Comment: NORMAL
Neisseria Gonorrhea: NEGATIVE

## 2020-12-23 ENCOUNTER — Other Ambulatory Visit: Payer: Self-pay

## 2020-12-23 ENCOUNTER — Encounter (INDEPENDENT_AMBULATORY_CARE_PROVIDER_SITE_OTHER): Payer: Medicaid Other | Admitting: Obstetrics & Gynecology

## 2020-12-24 NOTE — Progress Notes (Signed)
Pt was not seen on this date.

## 2021-01-06 ENCOUNTER — Ambulatory Visit (INDEPENDENT_AMBULATORY_CARE_PROVIDER_SITE_OTHER): Payer: Medicaid Other | Admitting: Family Medicine

## 2021-01-06 VITALS — BP 110/71 | HR 92 | Wt 169.0 lb

## 2021-01-06 DIAGNOSIS — O99352 Diseases of the nervous system complicating pregnancy, second trimester: Secondary | ICD-10-CM

## 2021-01-06 DIAGNOSIS — D069 Carcinoma in situ of cervix, unspecified: Secondary | ICD-10-CM

## 2021-01-06 DIAGNOSIS — O09899 Supervision of other high risk pregnancies, unspecified trimester: Secondary | ICD-10-CM

## 2021-01-06 DIAGNOSIS — G40909 Epilepsy, unspecified, not intractable, without status epilepticus: Secondary | ICD-10-CM

## 2021-01-06 MED ORDER — CYCLOBENZAPRINE HCL 10 MG PO TABS: 10.0000 mg | ORAL_TABLET | Freq: Two times a day (BID) | ORAL | 3 refills | Status: DC | PRN

## 2021-01-06 NOTE — Progress Notes (Signed)
Patient requesting "some kind of muscle relaxer" to help ease her cramping.  Patient still not taking her Keppra. Patient reports she has not had any seizures recently. Armandina Stammer RN

## 2021-01-06 NOTE — Progress Notes (Signed)
   PRENATAL VISIT NOTE  Subjective:  Jordan Blair is a 30 y.o. [id number] at [redacted]w[redacted]d being seen today for ongoing prenatal care.  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  Patient reports round ligament pain. Had flexeril, which was helpful..  Contractions: Irregular. Vag. Bleeding: None.  Movement: Present. Denies leaking of fluid.   The following portions of the patient's history were reviewed and updated as appropriate: allergies, current medications, past family history, past medical history, past social history, past surgical history and problem list.   Objective:   Vitals:   01/06/21 1408  BP: 110/71  Pulse: 92  Weight: 169 lb (76.7 kg)    Fetal Status: Fetal Heart Rate (bpm): 150   Movement: Present     General:  Alert, oriented and cooperative. Patient is in no acute distress.  Skin: Skin is warm and dry. No rash noted.   Cardiovascular: Normal heart rate noted  Respiratory: Normal respiratory effort, no problems with respiration noted  Abdomen: Soft, gravid, appropriate for gestational age.  Pain/Pressure: Absent     Pelvic: Cervical exam deferred        Extremities: Normal range of motion.  Edema: Trace  Mental Status: Normal mood and affect. Normal behavior. Normal judgment and thought content.   Assessment and Plan:  Pregnancy: [id number] at [redacted]w[redacted]d 1. Supervision of other high risk pregnancy, antepartum FHT and FH normal  2. Hx of preterm delivery, currently pregnant Patient had decided to stop makena.  No contractions currently.  3. Severe dysplasia of cervix (CIN III) Rpt PAP/Colpo postpartum  4. Seizure disorder during pregnancy in second trimester [facility]) Not currently on  lamictal.  No recent seizures.  Preterm labor symptoms and general obstetric precautions including but not limited to vaginal bleeding, contractions, leaking of fluid and fetal movement were reviewed in detail with the patient. Please refer to After Visit Summary for other counseling recommendations.   No follow-ups on file.  Future Appointments  Date Time Provider Department Center  01/20/2021  2:30 PM Levie Heritage, DO [facility] None  01/26/2021 11:15 AM Levie Heritage, DO [facility] None  02/02/2021  2:30 PM Levie Heritage, DO [facility] None  02/03/2021  4:00 PM York Spaniel, MD [facility] None    Levie Heritage, DO

## 2021-01-20 ENCOUNTER — Ambulatory Visit (INDEPENDENT_AMBULATORY_CARE_PROVIDER_SITE_OTHER): Payer: Medicaid Other | Admitting: Family Medicine

## 2021-01-20 ENCOUNTER — Other Ambulatory Visit: Payer: Self-pay

## 2021-01-20 VITALS — BP 110/66 | HR 106 | Wt 168.0 lb

## 2021-01-20 DIAGNOSIS — O09899 Supervision of other high risk pregnancies, unspecified trimester: Secondary | ICD-10-CM

## 2021-01-20 DIAGNOSIS — G40909 Epilepsy, unspecified, not intractable, without status epilepticus: Secondary | ICD-10-CM

## 2021-01-20 DIAGNOSIS — D069 Carcinoma in situ of cervix, unspecified: Secondary | ICD-10-CM

## 2021-01-20 DIAGNOSIS — O99352 Diseases of the nervous system complicating pregnancy, second trimester: Secondary | ICD-10-CM

## 2021-01-20 DIAGNOSIS — Z3A35 35 weeks gestation of pregnancy: Secondary | ICD-10-CM

## 2021-01-20 DIAGNOSIS — Z3A18 18 weeks gestation of pregnancy: Secondary | ICD-10-CM

## 2021-01-20 MED ORDER — PANTOPRAZOLE SODIUM 40 MG PO TBEC: 40.0000 mg | DELAYED_RELEASE_TABLET | Freq: Every day | ORAL | 3 refills | Status: DC

## 2021-01-20 NOTE — Progress Notes (Signed)
  History:  [name] is a 30 y.o. [phone] who presents to clinic today for routine OB care. She reports intermittent nausea and occasional vomiting throughout the pregnancy. She also has severe GERD symptoms after meals and at night. She sleeps propped up on a pillow which helps. No LOF or VB. +FM. No RUQ pain, headaches, LE edema, or vision changes. Does have intermittent lightheadedness and thinks it's d/t dehydration.   The following portions of the patient's history were reviewed and updated as appropriate: allergies, current medications, family history, past medical history, social history, past surgical history and problem list.   Past Medical History:   Past Medical History:  Diagnosis Date  . Seizures (HCC)     Review of Systems:  Review of Systems  Gastrointestinal: Positive for heartburn and nausea.  All other systems reviewed and are negative.     Objective:  Physical Exam LMP 05/20/2020  Physical Exam Constitutional:      Appearance: Normal appearance.  Cardiovascular:     Rate and Rhythm: Normal rate.  Pulmonary:     Effort: Pulmonary effort is normal.  Musculoskeletal:        General: No swelling.  Neurological:     Mental Status: She is alert.      Assessment & Plan:  1. [redacted] weeks gestation of pregnancy, [id number] - +FM. No LOF or VB - MOF: breast  - MOC: Nexplanon  - GBS swab at next visit   2. GERD - Protonix daily with meals    3. Seizure disorder  - no recent seizure activity; takes Keppra sparingly, reassured it is safe to take in pregnancy and encouraged use     Approximately 20 minutes of total time was spent with this patient on prenatal care and discussion.   Dot Lanes, Medical Student 01/20/2021 2:43 PM

## 2021-01-20 NOTE — Progress Notes (Signed)
   PRENATAL VISIT NOTE  Subjective:  Jordan Blair is a 30 y.o. [id number] at [redacted]w[redacted]d being seen today for ongoing prenatal care.  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  Patient reports heartburn.  Contractions: Not present. Vag. Bleeding: None.  Movement: Present. Denies leaking of fluid.   The following portions of the patient's history were reviewed and updated as appropriate: allergies, current medications, past family history, past medical history, past social history, past surgical history and problem list.   Objective:   Vitals:   01/20/21 1442  BP: 110/66  Pulse: (!) 106  Weight: 168 lb (76.2 kg)    Fetal Status: Fetal Heart Rate (bpm): 147   Movement: Present     General:  Alert, oriented and cooperative. Patient is in no acute distress.  Skin: Skin is warm and dry. No rash noted.   Cardiovascular: Normal heart rate noted  Respiratory: Normal respiratory effort, no problems with respiration noted  Abdomen: Soft, gravid, appropriate for gestational age.  Pain/Pressure: Present     Pelvic: Cervical exam deferred        Extremities: Normal range of motion.  Edema: Trace  Mental Status: Normal mood and affect. Normal behavior. Normal judgment and thought content.   Assessment and Plan:  Pregnancy: [id number] at [redacted]w[redacted]d 1. [redacted] weeks gestation of pregnancy  2. Supervision of other high risk pregnancy, antepartum FHT and FH normal  3. Seizure disorder during pregnancy in second trimester [facility]) Not compliant with medication due to the way she feels. No seizure disorder  4. Severe dysplasia of cervix (CIN III) PAP/Colpo postpartum  5. Hx of preterm delivery, currently pregnant Stopped  makena  Preterm labor symptoms and general obstetric precautions including but not limited to vaginal bleeding, contractions, leaking of fluid and fetal movement were reviewed in detail with the patient. Please refer to After Visit Summary for other counseling recommendations.   No follow-ups on file.  Future Appointments  Date Time Provider Department Center  01/26/2021 11:15 AM Levie Heritage, DO [facility] None  02/02/2021  2:30 PM Levie Heritage, DO [facility] None  02/03/2021  4:00 PM York Spaniel, MD [facility] None    Levie Heritage, DO

## 2021-01-24 ENCOUNTER — Inpatient Hospital Stay (HOSPITAL_COMMUNITY)
Admission: AD | Admit: 2021-01-24 | Discharge: 2021-01-24 | Disposition: A | Discharge status: Home / Self Care | Payer: Medicaid Other | Attending: Family Medicine | Admitting: Family Medicine

## 2021-01-24 ENCOUNTER — Encounter (HOSPITAL_COMMUNITY): Payer: Self-pay | Admitting: Family Medicine

## 2021-01-24 DIAGNOSIS — Z87891 Personal history of nicotine dependence: Secondary | ICD-10-CM | POA: Diagnosis not present

## 2021-01-24 DIAGNOSIS — O99353 Diseases of the nervous system complicating pregnancy, third trimester: Secondary | ICD-10-CM | POA: Diagnosis not present

## 2021-01-24 DIAGNOSIS — Z3A35 35 weeks gestation of pregnancy: Secondary | ICD-10-CM | POA: Diagnosis not present

## 2021-01-24 DIAGNOSIS — K219 Gastro-esophageal reflux disease without esophagitis: Secondary | ICD-10-CM | POA: Diagnosis not present

## 2021-01-24 DIAGNOSIS — R109 Unspecified abdominal pain: Secondary | ICD-10-CM

## 2021-01-24 DIAGNOSIS — R102 Pelvic and perineal pain: Secondary | ICD-10-CM | POA: Diagnosis present

## 2021-01-24 DIAGNOSIS — O09893 Supervision of other high risk pregnancies, third trimester: Secondary | ICD-10-CM | POA: Diagnosis not present

## 2021-01-24 DIAGNOSIS — O26893 Other specified pregnancy related conditions, third trimester: Secondary | ICD-10-CM

## 2021-01-24 DIAGNOSIS — G40909 Epilepsy, unspecified, not intractable, without status epilepticus: Secondary | ICD-10-CM | POA: Diagnosis not present

## 2021-01-24 DIAGNOSIS — Z3689 Encounter for other specified antenatal screening: Secondary | ICD-10-CM

## 2021-01-24 DIAGNOSIS — O09213 Supervision of pregnancy with history of pre-term labor, third trimester: Secondary | ICD-10-CM | POA: Diagnosis not present

## 2021-01-24 DIAGNOSIS — O26899 Other specified pregnancy related conditions, unspecified trimester: Secondary | ICD-10-CM

## 2021-01-24 DIAGNOSIS — O99613 Diseases of the digestive system complicating pregnancy, third trimester: Secondary | ICD-10-CM | POA: Insufficient documentation

## 2021-01-24 DIAGNOSIS — O09899 Supervision of other high risk pregnancies, unspecified trimester: Secondary | ICD-10-CM

## 2021-01-24 HISTORY — DX: Depression, unspecified: F32.A

## 2021-01-24 LAB — COMPREHENSIVE METABOLIC PANEL
ALT: 11 U/L (ref 0–44)
AST: 21 U/L (ref 15–41)
Albumin: 2.7 g/dL — ABNORMAL LOW (ref 3.5–5.0)
Alkaline Phosphatase: 105 U/L (ref 38–126)
Anion gap: 9 (ref 5–15)
BUN: <5 mg/dL — ABNORMAL LOW (ref 6–20)
CO2: 23 mmol/L (ref 22–32)
Calcium: 9 mg/dL (ref 8.9–10.3)
Chloride: 102 mmol/L (ref 98–111)
Creatinine, Ser: 0.78 mg/dL (ref 0.44–1.00)
GFR, Estimated: >60 mL/min (ref >60)
Glucose, Bld: 125 mg/dL — ABNORMAL HIGH (ref 70–99)
Potassium: 3.4 mmol/L — ABNORMAL LOW (ref 3.5–5.1)
Sodium: 134 mmol/L — ABNORMAL LOW (ref 135–145)
Total Bilirubin: 0.3 mg/dL (ref 0.3–1.2)
Total Protein: 5.9 g/dL — ABNORMAL LOW (ref 6.5–8.1)

## 2021-01-24 LAB — CBC
HCT: 37.4 % (ref 36.0–46.0)
Hemoglobin: 12.4 g/dL (ref 12.0–15.0)
MCH: 29.5 pg (ref 26.0–34.0)
MCHC: 33.2 g/dL (ref 30.0–36.0)
MCV: 89 fL (ref 80.0–100.0)
Platelets: 265 10*3/uL (ref 150–400)
RBC: 4.2 MIL/uL (ref 3.87–5.11)
RDW: 13.9 % (ref 11.5–15.5)
WBC: 8 10*3/uL (ref 4.0–10.5)
nRBC: 0 % (ref 0.0–0.2)

## 2021-01-24 MED ORDER — LACTATED RINGERS IV BOLUS
1000.0000 mL | Freq: Once | INTRAVENOUS | Status: AC
  Administered 2021-01-24: 1000 mL via INTRAVENOUS

## 2021-01-24 MED ORDER — PANTOPRAZOLE SODIUM 40 MG IV SOLR
40.0000 mg | Freq: Once | INTRAVENOUS | Status: AC
  Administered 2021-01-24: 40 mg via INTRAVENOUS
  Filled 2021-01-24: qty 40

## 2021-01-24 MED ORDER — ONDANSETRON HCL 4 MG/2ML IJ SOLN
4.0000 mg | Freq: Once | INTRAMUSCULAR | Status: AC
  Administered 2021-01-24: 4 mg via INTRAVENOUS
  Filled 2021-01-24: qty 2

## 2021-01-24 NOTE — MAU Note (Signed)
Acid reflux got really bad about 0300.  [employer], but not really helping.  Did not pick up meds for it yet. Gets so bad, it chokes her and she throws up.  Thanks she lost her mucous plug, no other bleeding or leaking..  Feeling a lot of pelvic pressure

## 2021-01-24 NOTE — Discharge Instructions (Signed)
Heartburn During Pregnancy  Heartburn is a type of pain or discomfort in the throat or chest. It may cause a burning feeling. It happens when stomach acid backs up into the part of the body that moves food from your mouth to your stomach (esophagus). This condition is also called acid reflux. Heartburn is common during pregnancy. It usually goes away or gets better after giving birth. What are the causes? This condition is caused by stomach acid that backs up into the part of the body that moves food from your mouth to your stomach. Acid can back up because of: Changing amounts of hormones in the body. Large meals. Certain foods and drinks. Exercise. More acid being made in the stomach. What increases the risk? You are more likely to develop this condition if: You had heartburn before you became pregnant. You have been pregnant more than once before. You are overweight or obese. Heartburn is also likely to happen as you get further along in your pregnancy. The risk is higher in the last 3 months before birth (third trimester). What are the signs or symptoms? Symptoms of this condition include: Burning pain in the chest or lower throat. A bitter taste in the mouth. Coughing. Problems swallowing. Vomiting. A hoarse voice. Asthma. Symptoms may get worse when you lie down or bend over. You may feel worse at night. How is this treated? Treatment for this condition depends on how bad your symptoms are. Your doctor may ask you to: Take over-the-counter medicines for mild heartburn. These medicines include antacids or acid reducers. Take prescription medicines to reduce stomach acid or to protect your stomach. Change your diet. Raise the head of your bed so it is higher than the foot of the bed. Follow these instructions at home: Eating and drinking Do not drink alcohol while you are pregnant. Learn which foods and drinks make you feel worse, and avoid them. Eat small meals often, instead  of large meals. Avoid drinking a lot of liquid with your meals. Avoid eating meals during the 2-3 hours before you go to bed. Avoid lying down right after you eat. Do not exercise right after you eat. Drinks to avoid Coffee and tea (with or without caffeine). Energy drinks and sports drinks. Carbonated drinks or sodas. Citrus fruit juices. Foods to avoid Chocolate and cocoa. Peppermint and mint flavorings. Garlic, onions, and horseradish. Spicy foods and foods that have a lot of acid in them. These include peppers, chili powder, curry powder, vinegar, hot sauces, and barbecue sauce. Citrus fruits, such as oranges, lemons, and limes. Tomato-based foods, such as red sauce, chili, and salsa. Fried and fatty foods, such as donuts, french fries, potato chips, and high-fat dressings. High-fat meats, such as hot dogs, precooked or cured meats, sausage, ham, and bacon. High-fat dairy items, such as whole milk, butter, and cheese. Medicines Take over-the-counter and prescription medicines only as told by your doctor. Do not take aspirin or NSAIDs, such as ibuprofen, unless your doctor tells you to do that. Your doctor may tell you to avoid medicines that have sodium bicarbonate in them. General instructions If told, raise the head of your bed about 6 inches (15 cm). You can do this by putting blocks under the legs. Sleeping with more pillows does not help with heartburn. Do not use any products that contain nicotine or tobacco, such as cigarettes, e-cigarettes, and chewing tobacco. If you need help quitting, ask your doctor. Wear loose-fitting clothing. Try to lower your stress, such as with   more pillows does not help with heartburn.  Do not use any products that contain nicotine or tobacco, such as cigarettes, e-cigarettes, and chewing tobacco. If you need help quitting, ask your doctor.  Wear loose-fitting clothing.  Try to lower your stress, such as with yoga or meditation. If you need help, ask your doctor.  Stay at a healthy weight. If you are overweight, work with your doctor to safely manage your weight.  Keep all follow-up visits as told by your doctor. This  is important. Where to find more information  American Pregnancy Association: americanpregnancy.org Contact a doctor if:  You get new symptoms.  Your symptoms do not get better with treatment.  You lose weight and you do not know why.  You have trouble swallowing.  You make loud sounds when you breathe (wheeze).  You have a cough that does not go away.  You have heartburn often for more than 2 weeks.  You feel like you may vomit (nausea), or you vomit, and this does not get better with treatment.  You have pain in your belly (abdomen). Get help right away if:  You have very bad chest pain that spreads to your arm, neck, or jaw.  You feel sweaty, dizzy, or light-headed.  You have trouble breathing.  You have pain when swallowing.  You vomit, and your vomit looks like blood or coffee grounds.  Your poop (stool) is bloody or black. Summary  Heartburn in pregnancy is common, especially during the last 3 months before birth.  This condition is caused by stomach acid backing up into the part of the body that moves food from the mouth to the stomach.  This condition can be treated with medicines, changes to your diet, or raising the head of your bed.  Contact a doctor if your symptoms do not go away or you get new symptoms. This information is not intended to replace advice given to you by your health care provider. Make sure you discuss any questions you have with your health care provider. Document Revised: 06/18/2019 Document Reviewed: 06/18/2019 Elsevier Patient Education  2021 Elsevier Inc.  

## 2021-01-24 NOTE — MAU Provider Note (Signed)
History     CSN: [id number]  Arrival date and time: 01/24/21 1314   Event Date/Time   First Provider Initiated Contact with Patient 01/24/21 1324      Chief Complaint  Patient presents with  . pelvic pressure  . Emesis   HPI This is a 29yo [id number] at [redacted]w[redacted]d with pregnancy complicated by seizure disorder, preterm labor. She presents with nausea and vomiting that started early this morning. Preceded by increased acid reflux. Emesis consists of stomach contents. She's now having some lower abdominal pain, in particular on the left side.  No fevers, chills, contractions, leaking fluid.  OB History    Gravida  3   Para  2   Term      Preterm  2   AB      Living  2     SAB      IAB      Ectopic      Multiple      Live Births  2           Past Medical History:  Diagnosis Date  . Depression    'doing her best', no meds  . Seizures (HCC)     Past Surgical History:  Procedure Laterality Date  . INDUCED ABORTION    . NO PAST SURGERIES      Family History  Problem Relation Age of Onset  . Healthy Mother   . Healthy Father   . Anxiety disorder Maternal Grandmother   . Arthritis Maternal Grandmother   . Asthma Maternal Grandmother   . Hypertension Maternal Grandmother     Social History   Tobacco Use  . Smoking status: Former Smoker    Packs/day: 0.50  . Smokeless tobacco: Never Used  Vaping Use  . Vaping Use: Never used  Substance Use Topics  . Alcohol use: Not Currently    Comment: social  . Drug use: Not Currently    Types: Marijuana    Allergies:  Allergies  Allergen Reactions  . Amoxicillin     Other reaction(s): GI Upset (intolerance)    Medications Prior to Admission  Medication Sig Dispense Refill Last Dose  . cyclobenzaprine (FLEXERIL) 10 MG tablet Take 1 tablet (10 mg total) by mouth 2 (two) times daily as needed for muscle spasms. 30 tablet 3 01/24/2021 at Unknown time  . folic acid (FOLVITE) 1 MG tablet Take 1 tablet (1 mg  total) by mouth daily. 30 tablet 10 01/23/2021 at Unknown time  . Prenatal Vit-Fe Fumarate-FA (PRENATAL VITAMINS PO) Take by mouth.   01/23/2021 at Unknown time  . levETIRAcetam (KEPPRA) 750 MG tablet Take 1 tablet (750 mg total) by mouth 2 (two) times daily. (Patient not taking: No sig reported) 60 tablet 3 More than a month at Unknown time  . pantoprazole (PROTONIX) 40 MG tablet Take 1 tablet (40 mg total) by mouth daily. 30 tablet 3     Review of Systems Physical Exam   Blood pressure 116/79, pulse (!) 103, temperature 97.7 F (36.5 C), temperature source Oral, resp. rate 20, last menstrual period 05/20/2020.  Physical Exam Vitals reviewed. Exam conducted with a chaperone present.  Constitutional:      Appearance: Normal appearance.  HENT:     Head: Normocephalic and atraumatic.     Right Ear: Tympanic membrane normal.     Left Ear: Tympanic membrane normal.     Mouth/Throat:     Mouth: Mucous membranes are moist.  Cardiovascular:     Rate and  Rhythm: Normal rate and regular rhythm.     Pulses: Normal pulses.     Heart sounds: Normal heart sounds.  Pulmonary:     Effort: Pulmonary effort is normal.  Abdominal:     General: Abdomen is flat.     Palpations: Abdomen is soft.     Tenderness: There is abdominal tenderness (on the left side of uterus). There is no guarding or rebound.  Skin:    General: Skin is warm.     Capillary Refill: Capillary refill takes less than 2 seconds.  Neurological:     General: No focal deficit present.     Mental Status: She is alert.  Psychiatric:        Mood and Affect: Mood normal.        Behavior: Behavior normal.        Thought Content: Thought content normal.        Judgment: Judgment normal.    Dilation: 1 Effacement (%): Thick Exam by:: Giancarlo Askren  Results for orders placed or performed during the hospital encounter of 01/24/21 (from the past 24 hour(s))  Comprehensive metabolic panel     Status: Abnormal   Collection Time: 01/24/21   1:48 PM  Result Value Ref Range   Sodium 134 (L) 135 - 145 mmol/L   Potassium 3.4 (L) 3.5 - 5.1 mmol/L   Chloride 102 98 - 111 mmol/L   CO2 23 22 - 32 mmol/L   Glucose, Bld 125 (H) 70 - 99 mg/dL   BUN <5 (L) 6 - 20 mg/dL   Creatinine, Ser 3.23 0.44 - 1.00 mg/dL   Calcium 9.0 8.9 - 55.7 mg/dL   Total Protein 5.9 (L) 6.5 - 8.1 g/dL   Albumin 2.7 (L) 3.5 - 5.0 g/dL   AST 21 15 - 41 U/L   ALT 11 0 - 44 U/L   Alkaline Phosphatase 105 38 - 126 U/L   Total Bilirubin 0.3 0.3 - 1.2 mg/dL   GFR, Estimated >32 >20 mL/min   Anion gap 9 5 - 15  CBC     Status: None   Collection Time: 01/24/21  1:48 PM  Result Value Ref Range   WBC 8.0 4.0 - 10.5 K/uL   RBC 4.20 3.87 - 5.11 MIL/uL   Hemoglobin 12.4 12.0 - 15.0 g/dL   HCT 25.4 27.0 - 62.3 %   MCV 89.0 80.0 - 100.0 fL   MCH 29.5 26.0 - 34.0 pg   MCHC 33.2 30.0 - 36.0 g/dL   RDW 76.2 83.1 - 51.7 %   Platelets 265 150 - 400 K/uL   nRBC 0.0 0.0 - 0.2 %     MAU Course  Procedures NST: baseline 140, mod variability, + accel, neg decel.  MDM   Assessment and Plan  1. Supervision of other high risk pregnancy, antepartum  2. Hx of preterm delivery, currently pregnant  3. [redacted] weeks gestation of pregnancy  4. NST (non-stress test) reactive  5. Current pregnancy with history of pre-term labor in third trimester  6. Abdominal pain affecting pregnancy  Improved after 1L bolus, Zofran, protonix. D/c to home.  Levie Heritage 01/24/2021, 1:36 PM

## 2021-01-26 ENCOUNTER — Encounter: Payer: Medicaid Other | Admitting: Family Medicine

## 2021-01-27 ENCOUNTER — Encounter: Payer: Medicaid Other | Admitting: Family Medicine

## 2021-02-02 ENCOUNTER — Ambulatory Visit (INDEPENDENT_AMBULATORY_CARE_PROVIDER_SITE_OTHER): Payer: Medicaid Other | Admitting: Family Medicine

## 2021-02-02 ENCOUNTER — Other Ambulatory Visit: Payer: Self-pay

## 2021-02-02 ENCOUNTER — Other Ambulatory Visit (HOSPITAL_COMMUNITY)
Admission: RE | Admit: 2021-02-02 | Discharge: 2021-02-02 | Disposition: A | Discharge status: Home / Self Care | Payer: Medicaid Other | Source: Ambulatory Visit | Attending: Family Medicine | Admitting: Family Medicine

## 2021-02-02 VITALS — BP 125/84 | HR 127 | Wt 173.0 lb

## 2021-02-02 DIAGNOSIS — O09899 Supervision of other high risk pregnancies, unspecified trimester: Secondary | ICD-10-CM

## 2021-02-02 DIAGNOSIS — D069 Carcinoma in situ of cervix, unspecified: Secondary | ICD-10-CM

## 2021-02-02 DIAGNOSIS — O99352 Diseases of the nervous system complicating pregnancy, second trimester: Secondary | ICD-10-CM

## 2021-02-02 DIAGNOSIS — G40909 Epilepsy, unspecified, not intractable, without status epilepticus: Secondary | ICD-10-CM

## 2021-02-02 NOTE — Progress Notes (Signed)
   PRENATAL VISIT NOTE  Subjective:  Jordan Blair is a 30 y.o. [id number] at [redacted]w[redacted]d being seen today for ongoing prenatal care.  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  Patient reports No seizures. Has been intermittently taking seizure medication.  Contractions: Not present. Vag. Bleeding: None.  Movement: Present. Denies leaking of fluid.   The following portions of the patient's history were reviewed and updated as appropriate: allergies, current medications, past family history, past medical history, past social history, past surgical history and problem list.   Objective:   Vitals:   02/02/21 1423  BP: 125/84  Pulse: (!) 127  Weight: 173 lb (78.5 kg)    Fetal Status: Fetal Heart Rate (bpm): 145   Movement: Present  Presentation: Vertex  General:  Alert, oriented and cooperative. Patient is in no acute distress.  Skin: Skin is warm and dry. No rash noted.   Cardiovascular: Normal heart rate noted  Respiratory: Normal respiratory effort, no problems with respiration noted  Abdomen: Soft, gravid, appropriate for gestational age.  Pain/Pressure: Present     Pelvic: Cervical exam deferred Dilation: 3 Effacement (%): 50 Station: -3  Extremities: Normal range of motion.  Edema: Trace  Mental Status: Normal mood and affect. Normal behavior. Normal judgment and thought content.   Assessment and Plan:  Pregnancy: [id number] at [redacted]w[redacted]d 1. Supervision of other high risk pregnancy, antepartum FHT and FH normal - Culture, beta strep (group b only) - GC/Chlamydia probe amp ([place])not at [facility]  2. Hx of preterm delivery, currently pregnant FHT and FH normal - Culture, beta strep (group b  only) - GC/Chlamydia probe amp ([place])not at [facility]  3. Seizure disorder during pregnancy in second trimester (HCC) Recommended consistently taking antiepileptics.  4. Severe dysplasia of cervix (CIN III) colpo PP.  Preterm labor symptoms and general obstetric precautions including but not limited to vaginal bleeding, contractions, leaking of fluid and fetal movement were reviewed in detail with the patient. Please refer to After Visit Summary for other counseling recommendations.   No follow-ups on file.  Future Appointments  Date Time Provider Department Center  02/03/2021  4:00 PM York Spaniel, MD [facility] None  02/09/2021  3:30 PM Levie Heritage, DO [facility] None  02/16/2021  2:45 PM Levie Heritage, DO [facility] None  02/23/2021  2:45 PM Adrian Blackwater Rhona Raider, DO [facility] None    Levie Heritage, DO

## 2021-02-03 ENCOUNTER — Telehealth: Payer: Self-pay | Admitting: Neurology

## 2021-02-03 ENCOUNTER — Ambulatory Visit: Payer: Self-pay | Admitting: Neurology

## 2021-02-03 ENCOUNTER — Encounter: Payer: Self-pay | Admitting: Neurology

## 2021-02-03 LAB — GC/CHLAMYDIA PROBE AMP (~~LOC~~) NOT AT ARMC
Chlamydia: NEGATIVE
Comment: NEGATIVE
Comment: NORMAL
Neisseria Gonorrhea: NEGATIVE

## 2021-02-03 NOTE — Telephone Encounter (Signed)
This patient did not show for a new patient appointment today. 

## 2021-02-06 LAB — CULTURE, BETA STREP (GROUP B ONLY): Strep Gp B Culture: NEGATIVE

## 2021-02-09 ENCOUNTER — Encounter: Payer: Medicaid Other | Admitting: Family Medicine

## 2021-02-10 ENCOUNTER — Telehealth: Payer: Self-pay

## 2021-02-10 NOTE — Telephone Encounter (Signed)
Patient called and is 38 weeks. Patient states she is having some bleeding now. Patient denies any contractions. Patient offered appointment in the office but declined. Told patient to seek care as soon as she can at maternity admissions unit at [facility]. Patient states understanding. Armandina Stammer RN

## 2021-02-16 ENCOUNTER — Encounter: Payer: Medicaid Other | Admitting: Family Medicine

## 2021-02-16 ENCOUNTER — Other Ambulatory Visit: Payer: Self-pay

## 2021-02-16 ENCOUNTER — Encounter (HOSPITAL_COMMUNITY): Payer: Self-pay | Admitting: Obstetrics & Gynecology

## 2021-02-16 ENCOUNTER — Inpatient Hospital Stay (HOSPITAL_COMMUNITY): Payer: Medicaid Other | Admitting: Anesthesiology

## 2021-02-16 ENCOUNTER — Inpatient Hospital Stay (HOSPITAL_COMMUNITY)
Admission: AD | Admit: 2021-02-16 | Discharge: 2021-02-18 | DRG: 805 | Disposition: A | Discharge status: Home / Self Care | Payer: Medicaid Other | Attending: Obstetrics and Gynecology | Admitting: Obstetrics and Gynecology

## 2021-02-16 DIAGNOSIS — Z86001 Personal history of in-situ neoplasm of cervix uteri: Secondary | ICD-10-CM

## 2021-02-16 DIAGNOSIS — R12 Heartburn: Secondary | ICD-10-CM | POA: Diagnosis present

## 2021-02-16 DIAGNOSIS — Z20822 Contact with and (suspected) exposure to covid-19: Secondary | ICD-10-CM | POA: Diagnosis present

## 2021-02-16 DIAGNOSIS — O411230 Chorioamnionitis, third trimester, not applicable or unspecified: Secondary | ICD-10-CM | POA: Diagnosis present

## 2021-02-16 DIAGNOSIS — Z30017 Encounter for initial prescription of implantable subdermal contraceptive: Secondary | ICD-10-CM | POA: Diagnosis not present

## 2021-02-16 DIAGNOSIS — Z3493 Encounter for supervision of normal pregnancy, unspecified, third trimester: Secondary | ICD-10-CM

## 2021-02-16 DIAGNOSIS — O09899 Supervision of other high risk pregnancies, unspecified trimester: Secondary | ICD-10-CM

## 2021-02-16 DIAGNOSIS — Z87891 Personal history of nicotine dependence: Secondary | ICD-10-CM

## 2021-02-16 DIAGNOSIS — O9962 Diseases of the digestive system complicating childbirth: Secondary | ICD-10-CM | POA: Diagnosis present

## 2021-02-16 DIAGNOSIS — O99354 Diseases of the nervous system complicating childbirth: Secondary | ICD-10-CM | POA: Diagnosis present

## 2021-02-16 DIAGNOSIS — O99355 Diseases of the nervous system complicating the puerperium: Secondary | ICD-10-CM | POA: Diagnosis not present

## 2021-02-16 DIAGNOSIS — Z3A38 38 weeks gestation of pregnancy: Secondary | ICD-10-CM

## 2021-02-16 DIAGNOSIS — G40909 Epilepsy, unspecified, not intractable, without status epilepticus: Secondary | ICD-10-CM | POA: Diagnosis present

## 2021-02-16 DIAGNOSIS — O26893 Other specified pregnancy related conditions, third trimester: Secondary | ICD-10-CM | POA: Diagnosis present

## 2021-02-16 LAB — CBC
HCT: 41.7 % (ref 36.0–46.0)
HCT: 33.9 % — ABNORMAL LOW (ref 36.0–46.0)
Hemoglobin: 13.8 g/dL (ref 12.0–15.0)
Hemoglobin: 11.5 g/dL — ABNORMAL LOW (ref 12.0–15.0)
MCH: 29.4 pg (ref 26.0–34.0)
MCH: 29.7 pg (ref 26.0–34.0)
MCHC: 33.1 g/dL (ref 30.0–36.0)
MCHC: 33.9 g/dL (ref 30.0–36.0)
MCV: 88.7 fL (ref 80.0–100.0)
MCV: 87.6 fL (ref 80.0–100.0)
Platelets: 266 10*3/uL (ref 150–400)
Platelets: 233 10*3/uL (ref 150–400)
RBC: 4.7 MIL/uL (ref 3.87–5.11)
RBC: 3.87 MIL/uL (ref 3.87–5.11)
RDW: 14.5 % (ref 11.5–15.5)
RDW: 14.4 % (ref 11.5–15.5)
WBC: 12 10*3/uL — ABNORMAL HIGH (ref 4.0–10.5)
WBC: 23.2 10*3/uL — ABNORMAL HIGH (ref 4.0–10.5)
nRBC: 0 % (ref 0.0–0.2)
nRBC: 0 % (ref 0.0–0.2)

## 2021-02-16 LAB — RESP PANEL BY RT-PCR (FLU A&B, COVID) ARPGX2
Influenza A by PCR: NEGATIVE
Influenza B by PCR: NEGATIVE
SARS Coronavirus 2 by RT PCR: NEGATIVE

## 2021-02-16 LAB — COMPREHENSIVE METABOLIC PANEL
ALT: 16 U/L (ref 0–44)
AST: 26 U/L (ref 15–41)
Albumin: 2.6 g/dL — ABNORMAL LOW (ref 3.5–5.0)
Alkaline Phosphatase: 134 U/L — ABNORMAL HIGH (ref 38–126)
Anion gap: 10 (ref 5–15)
BUN: 7 mg/dL (ref 6–20)
CO2: 21 mmol/L — ABNORMAL LOW (ref 22–32)
Calcium: 8.6 mg/dL — ABNORMAL LOW (ref 8.9–10.3)
Chloride: 101 mmol/L (ref 98–111)
Creatinine, Ser: 0.92 mg/dL (ref 0.44–1.00)
GFR, Estimated: >60 mL/min (ref >60)
Glucose, Bld: 75 mg/dL (ref 70–99)
Potassium: 3.7 mmol/L (ref 3.5–5.1)
Sodium: 132 mmol/L — ABNORMAL LOW (ref 135–145)
Total Bilirubin: 0.6 mg/dL (ref 0.3–1.2)
Total Protein: 5.6 g/dL — ABNORMAL LOW (ref 6.5–8.1)

## 2021-02-16 LAB — PROTEIN / CREATININE RATIO, URINE
Creatinine, Urine: 168.95 mg/dL
Protein Creatinine Ratio: 0.27 mg/mg{Cre} — ABNORMAL HIGH (ref 0.00–0.15)
Total Protein, Urine: 45 mg/dL

## 2021-02-16 LAB — TYPE AND SCREEN
ABO/RH(D): O POS
Antibody Screen: NEGATIVE

## 2021-02-16 LAB — RPR: RPR Ser Ql: NONREACTIVE

## 2021-02-16 MED ORDER — SODIUM CHLORIDE 0.9 % IV SOLN
2.0000 g | Freq: Four times a day (QID) | INTRAVENOUS | Status: DC
  Administered 2021-02-16: 2 g via INTRAVENOUS
  Filled 2021-02-16: qty 2000

## 2021-02-16 MED ORDER — FENTANYL-BUPIVACAINE-NACL 0.5-0.125-0.9 MG/250ML-% EP SOLN
EPIDURAL | Status: AC
  Filled 2021-02-16: qty 250

## 2021-02-16 MED ORDER — ONDANSETRON HCL 4 MG/2ML IJ SOLN
INTRAMUSCULAR | Status: AC
  Filled 2021-02-16: qty 2

## 2021-02-16 MED ORDER — OXYTOCIN BOLUS FROM INFUSION
333.0000 mL | Freq: Once | INTRAVENOUS | Status: AC
  Administered 2021-02-16: 333 mL via INTRAVENOUS

## 2021-02-16 MED ORDER — OXYCODONE-ACETAMINOPHEN 5-325 MG PO TABS: 2.0000 | ORAL_TABLET | ORAL | Status: DC | PRN

## 2021-02-16 MED ORDER — FENTANYL-BUPIVACAINE-NACL 0.5-0.125-0.9 MG/250ML-% EP SOLN
12.0000 mL/h | EPIDURAL | Status: DC | PRN
Start: 2021-02-16 — End: 2021-02-17

## 2021-02-16 MED ORDER — OXYCODONE-ACETAMINOPHEN 5-325 MG PO TABS
1.0000 | ORAL_TABLET | ORAL | Status: DC | PRN
  Administered 2021-02-17: 1 via ORAL
  Filled 2021-02-16: qty 1

## 2021-02-16 MED ORDER — GENTAMICIN SULFATE 40 MG/ML IJ SOLN
5.0000 mg/kg | INTRAVENOUS | Status: DC
  Administered 2021-02-16: 410 mg via INTRAVENOUS
  Filled 2021-02-16: qty 10.25

## 2021-02-16 MED ORDER — SOD CITRATE-CITRIC ACID 500-334 MG/5ML PO SOLN: 30.0000 mL | ORAL | Status: DC | PRN

## 2021-02-16 MED ORDER — CALCIUM CARBONATE ANTACID 500 MG PO CHEW
2.0000 | CHEWABLE_TABLET | Freq: Three times a day (TID) | ORAL | Status: DC | PRN
  Administered 2021-02-16: 400 mg via ORAL
  Filled 2021-02-16: qty 2

## 2021-02-16 MED ORDER — LIDOCAINE HCL (PF) 1 % IJ SOLN
30.0000 mL | INTRAMUSCULAR | Status: DC | PRN
  Filled 2021-02-16: qty 30

## 2021-02-16 MED ORDER — PHENYLEPHRINE 40 MCG/ML (10ML) SYRINGE FOR IV PUSH (FOR BLOOD PRESSURE SUPPORT): 80.0000 ug | PREFILLED_SYRINGE | INTRAVENOUS | Status: DC | PRN

## 2021-02-16 MED ORDER — ACETAMINOPHEN 325 MG PO TABS
650.0000 mg | ORAL_TABLET | ORAL | Status: DC | PRN
  Administered 2021-02-18: 650 mg via ORAL
  Filled 2021-02-16: qty 2

## 2021-02-16 MED ORDER — LEVETIRACETAM 750 MG PO TABS
750.0000 mg | ORAL_TABLET | Freq: Two times a day (BID) | ORAL | Status: DC
  Administered 2021-02-16 – 2021-02-18 (×4): 750 mg via ORAL
  Filled 2021-02-16 (×5): qty 1

## 2021-02-16 MED ORDER — LEVETIRACETAM 750 MG PO TABS
750.0000 mg | ORAL_TABLET | Freq: Two times a day (BID) | ORAL | Status: DC
  Administered 2021-02-16: 750 mg via ORAL
  Filled 2021-02-16 (×2): qty 1

## 2021-02-16 MED ORDER — OXYTOCIN-SODIUM CHLORIDE 30-0.9 UT/500ML-% IV SOLN
1.0000 m[IU]/min | INTRAVENOUS | Status: DC
  Administered 2021-02-16: 2 m[IU]/min via INTRAVENOUS

## 2021-02-16 MED ORDER — LACTATED RINGERS IV SOLN
500.0000 mL | Freq: Once | INTRAVENOUS | Status: AC
  Administered 2021-02-16: 500 mL via INTRAVENOUS

## 2021-02-16 MED ORDER — LORAZEPAM 2 MG/ML IJ SOLN
INTRAMUSCULAR | Status: AC
  Administered 2021-02-16: 2 mg
  Filled 2021-02-16: qty 1

## 2021-02-16 MED ORDER — LIDOCAINE HCL (PF) 1 % IJ SOLN
INTRAMUSCULAR | Status: DC | PRN
  Administered 2021-02-16: 5 mL via EPIDURAL

## 2021-02-16 MED ORDER — OXYTOCIN-SODIUM CHLORIDE 30-0.9 UT/500ML-% IV SOLN
2.5000 [IU]/h | INTRAVENOUS | Status: DC
  Filled 2021-02-16: qty 500

## 2021-02-16 MED ORDER — ALUM & MAG HYDROXIDE-SIMETH 200-200-20 MG/5ML PO SUSP
30.0000 mL | Freq: Once | ORAL | Status: AC
  Administered 2021-02-16: 30 mL via ORAL
  Filled 2021-02-16: qty 30

## 2021-02-16 MED ORDER — ACETAMINOPHEN 500 MG PO TABS
1000.0000 mg | ORAL_TABLET | Freq: Once | ORAL | Status: AC
  Administered 2021-02-16: 1000 mg via ORAL
  Filled 2021-02-16: qty 2

## 2021-02-16 MED ORDER — LIDOCAINE VISCOUS HCL 2 % MT SOLN
15.0000 mL | Freq: Once | OROMUCOSAL | Status: AC
  Administered 2021-02-16: 15 mL via ORAL
  Filled 2021-02-16: qty 15

## 2021-02-16 MED ORDER — DIPHENHYDRAMINE HCL 50 MG/ML IJ SOLN: 12.5000 mg | INTRAMUSCULAR | Status: DC | PRN

## 2021-02-16 MED ORDER — LORAZEPAM 2 MG/ML IJ SOLN: 2.0000 mg | INTRAMUSCULAR | Status: DC | PRN

## 2021-02-16 MED ORDER — LACTATED RINGERS IV SOLN
500.0000 mL | INTRAVENOUS | Status: DC | PRN
Start: 2021-02-16 — End: 2021-02-19

## 2021-02-16 MED ORDER — EPHEDRINE 5 MG/ML INJ: 10.0000 mg | INTRAVENOUS | Status: DC | PRN

## 2021-02-16 MED ORDER — FENTANYL-BUPIVACAINE-NACL 0.5-0.125-0.9 MG/250ML-% EP SOLN
EPIDURAL | Status: DC | PRN
  Administered 2021-02-16: 12 mL/h via EPIDURAL

## 2021-02-16 MED ORDER — ONDANSETRON HCL 4 MG/2ML IJ SOLN
4.0000 mg | Freq: Four times a day (QID) | INTRAMUSCULAR | Status: DC | PRN
  Administered 2021-02-16 (×2): 4 mg via INTRAVENOUS
  Filled 2021-02-16: qty 2

## 2021-02-16 MED ORDER — LACTATED RINGERS IV SOLN: INTRAVENOUS | Status: DC

## 2021-02-16 MED ORDER — LORAZEPAM 2 MG/ML IJ SOLN
INTRAMUSCULAR | Status: AC
  Filled 2021-02-16: qty 1

## 2021-02-16 MED ORDER — FAMOTIDINE IN NACL 20-0.9 MG/50ML-% IV SOLN
20.0000 mg | Freq: Once | INTRAVENOUS | Status: AC
  Administered 2021-02-16: 20 mg via INTRAVENOUS
  Filled 2021-02-16: qty 50

## 2021-02-16 MED ORDER — TERBUTALINE SULFATE 1 MG/ML IJ SOLN: 0.2500 mg | Freq: Once | INTRAMUSCULAR | Status: DC | PRN

## 2021-02-16 NOTE — Progress Notes (Addendum)
Shortly after VAVD, at approximately 1942, the patient had a grand mal seizure before the placenta was delivered and infant was being assessed by NICU at the warmer. Additional staff was requested to the bedside and code cart was brought to the room. RN's positioned the patient on her side and 2 mg of Ativan given IV push at 1944. At this time nonrebreather mask was applied at 10L. Oxygen saturation at this time was 72% and heart rate was in the 40's and rapid response team was called at 1944. Shortly after, the NICU respiratory therapist who was already present for delivery at the bedside started bagging the patient at 1945 while awaiting rapid response team. Since the patient's vitals were remaining low it was decided to call an adult code blue. The code team and anesthesiology team responded and a second IV was placed and labs were drawn. At 1950 after continuous bagging the patient became alert and able to follow commands. Patient was on 4L of oxygen nasal cannula at this time. Placenta was then delivered at 1959 and routine recovery began at this time. Patient's vitals remained stable in recovery and patient was weaned completely off of oxygen. See recovery documentation.

## 2021-02-16 NOTE — Progress Notes (Signed)
Jordan Blair is a 30 y.o. [id number] at [redacted]w[redacted]d by LMP admitted for active labor  Subjective: Pt comfortable with epidural. Family in room for support.    Objective: BP (!) 106/57   Pulse 96   Temp 97.6 F (36.4 C) (Oral)   Resp 16   LMP 05/20/2020   SpO2 98%  No intake/output data recorded. No intake/output data recorded.  FHT:  FHR: 135 bpm, variability: moderate,  accelerations:  Present,  decelerations:  Present variables with pushing UC:   regular, every 3 minutes SVE:   Dilation: 10 Effacement (%): 100 Station: Plus 1 Exam by:: [employer] RN  Labs: Lab Results  Component Value Date   WBC 12.0 (H) 02/16/2021   HGB 13.8 02/16/2021   HCT 41.7 02/16/2021   MCV 88.7 02/16/2021   PLT 266 02/16/2021    Assessment / Plan: Spontaneous labor, progressing normally  Labor: Pt pushed ~ 1 hour ago with small anterior lip but was unable to push past lip and became fatigued and had heartburn symptoms.  Heartburn improved with GI cocktail and pt resumed pushing 20 minutes ago at 10 cm dilation and feeling rectal pressure. Fetal station unchanged and pt fatigues with pushing easily. Discussed options including continuting to push at this time, turning epidural off for improved urge to push, and/or starting Pitocin.  Pt elected to start Pitocin for stronger contractions. RN to reposition pt for improved descent. Preeclampsia:  n/a Fetal Wellbeing:  Category II Pain Control:  Epidural I/D:  GBS neg Anticipated MOD:  NSVD  Sharen Counter 02/16/2021, 4:13 PM

## 2021-02-16 NOTE — Progress Notes (Signed)
Pharmacy Antibiotic Note  Jordan Blair is a 30 y.o. female admitted on 02/16/2021 with spontaneous onset of labor at [redacted]w[redacted]d.  Pharmacy has been consulted for Gentamicin dosing for chorioamnionitis/ Triple I.   Plan: Gentamicin 5mg /kg (410mg ) IV q24h Will continue to follow  Height: 5' (152.4 cm) Weight: 81.6 kg (180 lb) IBW/kg (Calculated) : 45.5  Temp (24hrs), Avg:98.5 F (36.9 C), Min:97.6 F (36.4 C), Max:100.8 F (38.2 C)  Recent Labs  Lab 02/16/21 0758  WBC 12.0*    CrCl cannot be calculated (Patient's most recent lab result is older than the maximum 21 days allowed.).    Allergies  Allergen Reactions  . Amoxicillin     Other reaction(s): GI Upset (intolerance)    Antimicrobials this admission: Ampicillin 2 gram IV q6h  5/11 >>   Thank you for allowing pharmacy to be a part of this patient's care.  04/18/21 02/16/2021 6:24 PM

## 2021-02-16 NOTE — Plan of Care (Signed)
  Problem: Education: Goal: Knowledge of Childbirth will improve Outcome: Completed/Met Goal: Ability to make informed decisions regarding treatment and plan of care will improve Outcome: Completed/Met Goal: Ability to state and carry out methods to decrease the pain will improve Outcome: Completed/Met Goal: Individualized Educational Video(s) Outcome: Completed/Met

## 2021-02-16 NOTE — Progress Notes (Signed)
Netta Fodge is a 30 y.o. [id number] at [redacted]w[redacted]d by LMP admitted for active labor  Subjective: Pt comfortable with epidural, feeling rectal pressure. Family member in room for support.  Objective: BP 125/75   Pulse 91   Temp 98 F (36.7 C) (Oral)   Resp 16   LMP 05/20/2020   SpO2 98%  No intake/output data recorded. No intake/output data recorded.  FHT:  FHR: 125 bpm, variability: moderate,  accelerations:  Present,  decelerations:  Present early  UC:   regular, every 3 minutes SVE:   Dilation: 10 Effacement (%): 100 Station: Plus 1 Exam by:: Maness, MD  Labs: Lab Results  Component Value Date   WBC 12.0 (H) 02/16/2021   HGB 13.8 02/16/2021   HCT 41.7 02/16/2021   MCV 88.7 02/16/2021   PLT 266 02/16/2021    Assessment / Plan: Spontaneous labor, progressing normally Positive scalp stimulation with cervical exam  Labor: Pushed x 3 contractions with pt, who is feeling significant heartburn, feels "lump in her throat" like her usual acid reflux symptoms, and reports fatigue with pushing.  Plan to treat heartburn with GI Cocktail and labor down at this time. Will resume pushing with increased urge to push and improved acid refllux symptoms. Preeclampsia:  n/a Fetal Wellbeing:  Category I Pain Control:  Epidural I/D:  GBS neg Anticipated MOD:  NSVD  Sharen Counter 02/16/2021, 2:27 PM

## 2021-02-16 NOTE — Lactation Note (Signed)
This note was copied from a baby's chart. Lactation Consultation Note  Patient Name: Jordan Blair [id number] Date: 03/03/2021   Age:30 hours   Mother coded in L and D. LC talked to RN and stated it would be better for [facility] support once Mom is on the floor.   Maternal Data    Feeding    LATCH Score                    Lactation Tools Discussed/Used    Interventions    Discharge    Consult Status      Jordan Blair  Jordan Blair 03/03/2021, 8:08 PM

## 2021-02-16 NOTE — MAU Note (Signed)
Presents with ctxs that began last night.  Denies LOF, had bloody show.  Endorses +FM.
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
## 2021-02-16 NOTE — Anesthesia Procedure Notes (Signed)
Epidural Patient location during procedure: OB Start time: 02/16/2021 8:57 AM End time: 02/16/2021 9:06 AM  Staffing Anesthesiologist: Trevor Iha, MD Performed: anesthesiologist   Preanesthetic Checklist Completed: patient identified, IV checked, site marked, risks and benefits discussed, surgical consent, monitors and equipment checked, pre-op evaluation and timeout performed  Epidural Patient position: sitting Prep: DuraPrep and site prepped and draped Patient monitoring: continuous pulse ox and blood pressure Approach: midline Location: L3-L4 Injection technique: LOR air  Needle:  Needle type: Tuohy  Needle gauge: 17 G Needle length: 9 cm and 9 Needle insertion depth: 7 cm Catheter type: closed end flexible Catheter size: 19 Gauge Catheter at skin depth: 13 cm Test dose: negative  Assessment Events: blood not aspirated, injection not painful, no injection resistance, no paresthesia and negative IV test  Additional Notes Patient identified. Risks/Benefits/Options discussed with patient including but not limited to bleeding, infection, nerve damage, paralysis, failed block, incomplete pain control, headache, blood pressure changes, nausea, vomiting, reactions to medication both or allergic, itching and postpartum back pain. Confirmed with bedside nurse the patient's most recent platelet count. Confirmed with patient that they are not currently taking any anticoagulation, have any bleeding history or any family history of bleeding disorders. Patient expressed understanding and wished to proceed. All questions were answered. Sterile technique was used throughout the entire procedure. Please see nursing notes for vital signs. Test dose was given through epidural needle and negative prior to continuing to dose epidural or start infusion. Warning signs of high block given to the patient including shortness of breath, tingling/numbness in hands, complete motor block, or any  concerning symptoms with instructions to call for help. Patient was given instructions on fall risk and not to get out of bed. All questions and concerns addressed with instructions to call with any issues.  1Attempt (S) . Patient tolerated procedure well.

## 2021-02-16 NOTE — H&P (Addendum)
OBSTETRIC ADMISSION HISTORY AND PHYSICAL  Jordan Blair is a 30 y.o. female [phone] with IUP at [redacted]w[redacted]d by LMP confirmed on 18 wk USpresenting for spontaneous onset of labor. She reports +FMs, No LOF, no VB, no blurry vision, headaches or peripheral edema, and RUQ pain.  She plans on bottle feeding. She requests Nexplanon for birth control.  She received her prenatal care at [facility]   Dating: By LMP Confirmed on 18 wk [place] --->  Estimated Date of Delivery: 02/24/21  Sono:  @[redacted]w[redacted]d , CWD, normal anatomy, cephalic presentation,  1518g, 42% EFW   Prenatal History/Complications:  - h/o preterm delivery x2 (makena in current pregnancy) - severe dysplasia (CIN III) - seizure disorder - Limited pre-natal care - Depression/Anxiety  Past Medical History: Past Medical History:  Diagnosis Date  . Depression    'doing her best', no meds  . Seizures (HCC)     Past Surgical History: Past Surgical History:  Procedure Laterality Date  . INDUCED ABORTION    . NO PAST SURGERIES      Obstetrical History: OB History    Gravida  3   Para  2   Term      Preterm  2   AB      Living  2     SAB      IAB      Ectopic      Multiple      Live Births  2           Social History Social History   Socioeconomic History  . Marital status: Single    Spouse name: Not on file  . Number of children: Not on file  . Years of education: Not on file  . Highest education level: Not on file  Occupational History  . Not on file  Tobacco Use  . Smoking status: Former Smoker    Packs/day: 0.50  . Smokeless tobacco: Never Used  Vaping Use  . Vaping Use: Never used  Substance and Sexual Activity  . Alcohol use: Not Currently    Comment: social  . Drug use: Not Currently    Types: Marijuana  . Sexual activity: Not Currently    Birth control/protection: None  Other Topics Concern  . Not on file  Social History Narrative  . Not on file   Social Determinants of Health    Financial Resource Strain: Not on file  Food Insecurity: Not on file  Transportation Needs: Not on file  Physical Activity: Not on file  Stress: Not on file  Social Connections: Not on file    Family History: Family History  Problem Relation Age of Onset  . Healthy Mother   . Healthy Father   . Anxiety disorder Maternal Grandmother   . Arthritis Maternal Grandmother   . Asthma Maternal Grandmother   . Hypertension Maternal Grandmother     Allergies: Allergies  Allergen Reactions  . Amoxicillin     Other reaction(s): GI Upset (intolerance)    Medications Prior to Admission  Medication Sig Dispense Refill Last Dose  . cyclobenzaprine (FLEXERIL) 10 MG tablet Take 1 tablet (10 mg total) by mouth 2 (two) times daily as needed for muscle spasms. 30 tablet 3   . folic acid (FOLVITE) 1 MG tablet Take 1 tablet (1 mg total) by mouth daily. 30 tablet 10   . levETIRAcetam (KEPPRA) 750 MG tablet Take 1 tablet (750 mg total) by mouth 2 (two) times daily. 60 tablet 3   . pantoprazole (  PROTONIX) 40 MG tablet Take 1 tablet (40 mg total) by mouth daily. 30 tablet 3   . Prenatal Vit-Fe Fumarate-FA (PRENATAL VITAMINS PO) Take by mouth.        Review of Systems   All systems reviewed and negative except as stated in HPI  Blood pressure 113/77, pulse 82, temperature 98 F (36.7 C), temperature source Oral, resp. rate 20, last menstrual period 05/20/2020, SpO2 98 %. General appearance: alert, cooperative and appears stated age Lungs: normal WOB Heart: regular rate  Extremities: no sign of DVT Presentation: cephalic Fetal monitoringBaseline: 145 bpm, Variability: Good {> 6 bpm), Accelerations: Reactive and Decelerations: intermittent variable decels Uterine activityFrequency: Every 2-3 minutes Dilation: 7 Effacement (%): 90 Station: -1 Exam by:: H.Price, RN  Prenatal labs: ABO, Rh: --/--/O POS (05/11 0755) Antibody: NEG (05/11 0755) Rubella: 1.35 (12/20 0949) RPR: Non Reactive  (02/03 0901)  HBsAg: Negative (12/20 0949)  HIV: Non Reactive (02/03 0901)  GBS: Negative/-- (04/27 1455)  2 hr Glucola wnl Genetic screening  wnl Anatomy US wnl  Prenatal Transfer Tool  Maternal Diabetes: No Genetic Screening: Normal Maternal Ultrasounds/Referrals: Normal Fetal Ultrasounds or other Referrals:  Had referral placed for fetal ultrasound, mother reports having done and it being normal but cannot find in chart  Maternal Substance Abuse:  No Significant Maternal Medications:  None Significant Maternal Lab Results: Group B Strep negative  Results for orders placed or performed during the hospital encounter of 02/16/21 (from the past 24 hour(s))  Type and screen [facility]   Collection Time: 02/16/21  7:55 AM  Result Value Ref Range   ABO/RH(D) O POS    Antibody Screen NEG    Sample Expiration      02/19/2021,2359 Performed at [facility], [address] [place], [place] [address]   Resp Panel by RT-PCR (Flu A&B, Covid) Nasopharyngeal Swab   Collection Time: 02/16/21  7:56 AM   Specimen: Nasopharyngeal Swab; Nasopharyngeal(NP) swabs in vial transport medium  Result Value Ref Range   SARS Coronavirus 2 by RT PCR NEGATIVE NEGATIVE   Influenza A by PCR NEGATIVE NEGATIVE   Influenza B by PCR NEGATIVE NEGATIVE  CBC   Collection Time: 02/16/21  7:58 AM  Result Value Ref Range   WBC 12.0 (H) 4.0 - 10.5 K/uL   RBC 4.70 3.87 - 5.11 MIL/uL   Hemoglobin 13.8 12.0 - 15.0 g/dL   HCT 10.2 72.5 - 36.6 %   MCV 88.7 80.0 - 100.0 fL   MCH 29.4 26.0 - 34.0 pg   MCHC 33.1 30.0 - 36.0 g/dL   RDW 44.0 34.7 - 42.5 %   Platelets 266 150 - 400 K/uL   nRBC 0.0 0.0 - 0.2 %    Patient Active Problem List   Diagnosis Date Noted  . Supervision of low-risk pregnancy, third trimester 02/16/2021  . 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 Seizure disorder during pregnancy in second trimester (HCC) 08/18/2020  . HSIL on Pap smear of cervix 07/21/2020   . Supervision of other high risk pregnancy, antepartum 07/16/2020  . Hx of preterm delivery, currently pregnant 07/16/2020    Assessment/Plan:  Jordan Blair is a 30 y.o. [id number] at [redacted]w[redacted]d here for spontaneous onset of labor.  #Labor: Progressing well.  SROM at 0856.  Will manage expectantly.      #Pain: Epidural in place #FWB: Category I #ID: GBS negative #MOF: bottle #MOC:Nexplanon #Circ: n/a #Seizure disorder:  Last seizure occurred in February 2022.  Has been prescribed Keppra and Oxcarbazapine during pregnancy.  Has not taken either consistently and does not have Neurologist she sees regularly.  Indicates took Keppra yesterday.  Will start Keppra today.  Was previously prescribed 750 mg BID, spoke with Pharmacy who indicated fine to continue this dose.  Neurology follow up outpatient. #HSIL:  Colposcopy on 12/09/20 showed CIN III.  Consider Diagnostic Excisional Procedure PP. #Depression:  Not currently on any medication.  SW consult PP.  Jovita Kussmaul, MD  02/16/2021, 11:21 AM   Midwife attestation: I have seen and examined this patient; I agree with above documentation in the resident's note.   Jordan Blair is a 30 y.o. [phone] here for spontaneous onset of labor.  PE: BP 122/81   Pulse 84   Temp 98 F (36.7 C) (Oral)   Resp 16   LMP 05/20/2020   SpO2 98%  Gen: calm comfortable, NAD Resp: normal effort, no distress Abd: gravid  ROS, labs, PMH reviewed  Plan: Admit to LD Labor: expectant management Fetal monitoring: Category I ID: GBS neg  Sharen Counter, CNM  02/16/2021, 1:50 PM

## 2021-02-16 NOTE — Anesthesia Preprocedure Evaluation (Signed)
Anesthesia Evaluation  Patient identified by MRN, date of birth, ID band Patient awake    Reviewed: Allergy & Precautions, Patient's Chart, lab work & pertinent test results  Airway Mallampati: II  TM Distance: >3 FB Neck ROM: Full    Dental no notable dental hx. (+) Teeth Intact, Dental Advisory Given   Pulmonary neg pulmonary ROS, former smoker,    Pulmonary exam normal breath sounds clear to auscultation       Cardiovascular Normal cardiovascular exam Rhythm:Regular Rate:Normal     Neuro/Psych Seizures - (Pt no longer on Keppra),  Depression    GI/Hepatic negative GI ROS, Neg liver ROS,   Endo/Other  negative endocrine ROS  Renal/GU      Musculoskeletal   Abdominal   Peds  Hematology Lab Results      Component                Value               Date                      WBC                      12.0 (H)            02/16/2021                HGB                      13.8                02/16/2021                HCT                      41.7                02/16/2021                MCV                      88.7                02/16/2021                PLT                      266                 02/16/2021              Anesthesia Other Findings   Reproductive/Obstetrics (+) Pregnancy                             Anesthesia Physical Anesthesia Plan  ASA: III  Anesthesia Plan: Epidural   Post-op Pain Management:    Induction:   PONV Risk Score and Plan:   Airway Management Planned:   Additional Equipment:   Intra-op Plan:   Post-operative Plan:   Informed Consent: I have reviewed the patients History and Physical, chart, labs and discussed the procedure including the risks, benefits and alternatives for the proposed anesthesia with the patient or authorized representative who has indicated his/her understanding and acceptance.       Plan Discussed with:   Anesthesia  Plan Comments: (38.6 wk G3P2 w hx  of sz disorder for LEA)        Anesthesia Quick Evaluation

## 2021-02-17 ENCOUNTER — Encounter (HOSPITAL_COMMUNITY): Payer: Self-pay | Admitting: Obstetrics & Gynecology

## 2021-02-17 MED ORDER — BENZOCAINE-MENTHOL 20-0.5 % EX AERO: 1.0000 "application " | INHALATION_SPRAY | CUTANEOUS | Status: DC | PRN

## 2021-02-17 MED ORDER — ONDANSETRON HCL 4 MG/2ML IJ SOLN: 4.0000 mg | INTRAMUSCULAR | Status: DC | PRN

## 2021-02-17 MED ORDER — SENNOSIDES-DOCUSATE SODIUM 8.6-50 MG PO TABS
2.0000 | ORAL_TABLET | Freq: Every day | ORAL | Status: DC
  Administered 2021-02-18: 2 via ORAL
  Filled 2021-02-17 (×2): qty 2

## 2021-02-17 MED ORDER — ACETAMINOPHEN 325 MG PO TABS
650.0000 mg | ORAL_TABLET | Freq: Four times a day (QID) | ORAL | Status: DC
  Administered 2021-02-17: 650 mg via ORAL
  Filled 2021-02-17: qty 2

## 2021-02-17 MED ORDER — FENTANYL CITRATE (PF) 100 MCG/2ML IJ SOLN: 100.0000 ug | INTRAMUSCULAR | Status: DC | PRN

## 2021-02-17 MED ORDER — SIMETHICONE 80 MG PO CHEW: 80.0000 mg | CHEWABLE_TABLET | ORAL | Status: DC | PRN

## 2021-02-17 MED ORDER — IBUPROFEN 600 MG PO TABS
600.0000 mg | ORAL_TABLET | Freq: Four times a day (QID) | ORAL | Status: DC
  Administered 2021-02-17 – 2021-02-18 (×8): 600 mg via ORAL
  Filled 2021-02-17 (×8): qty 1

## 2021-02-17 MED ORDER — WITCH HAZEL-GLYCERIN EX PADS: 1.0000 "application " | MEDICATED_PAD | CUTANEOUS | Status: DC | PRN

## 2021-02-17 MED ORDER — DIBUCAINE (PERIANAL) 1 % EX OINT: 1.0000 "application " | TOPICAL_OINTMENT | CUTANEOUS | Status: DC | PRN

## 2021-02-17 MED ORDER — PRENATAL MULTIVITAMIN CH
1.0000 | ORAL_TABLET | Freq: Every day | ORAL | Status: DC
  Administered 2021-02-17 – 2021-02-18 (×2): 1 via ORAL
  Filled 2021-02-17 (×2): qty 1

## 2021-02-17 MED ORDER — TETANUS-DIPHTH-ACELL PERTUSSIS 5-2.5-18.5 LF-MCG/0.5 IM SUSY: 0.5000 mL | PREFILLED_SYRINGE | Freq: Once | INTRAMUSCULAR | Status: DC

## 2021-02-17 MED ORDER — COCONUT OIL OIL: 1.0000 "application " | TOPICAL_OIL | Status: DC | PRN

## 2021-02-17 MED ORDER — DIPHENHYDRAMINE HCL 25 MG PO CAPS: 25.0000 mg | ORAL_CAPSULE | Freq: Four times a day (QID) | ORAL | Status: DC | PRN

## 2021-02-17 MED ORDER — ONDANSETRON HCL 4 MG PO TABS: 4.0000 mg | ORAL_TABLET | ORAL | Status: DC | PRN
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
## 2021-02-17 NOTE — Anesthesia Postprocedure Evaluation (Addendum)
Anesthesia Post Note  Patient: Jordan Blair  Procedure(s) Performed: AN AD HOC LABOR EPIDURAL     Patient location during evaluation: Mother Baby Anesthesia Type: Epidural Level of consciousness: awake and alert and oriented Pain management: satisfactory to patient Vital Signs Assessment: post-procedure vital signs reviewed and stable Respiratory status: respiratory function stable Cardiovascular status: stable Postop Assessment: no headache, no backache, epidural receding, patient able to bend at knees, no signs of nausea or vomiting and adequate PO intake Anesthetic complications: no   No complications documented.  Last Vitals:  Vitals:   02/17/21 0831 02/17/21 0900  BP: 99/66   Pulse: 98   Resp: 18   Temp: 36.6 C   SpO2: 97% 100%    Last Pain:  Vitals:   02/17/21 0840  TempSrc:   PainSc: 7    Pain Goal: Patients Stated Pain Goal: 0 (02/17/21 0429)                 Karleen Dolphin

## 2021-02-17 NOTE — Lactation Note (Signed)
This note was copied from a baby's chart. Lactation Consultation Note  Patient Name: Jordan Blair [id number] Date: 02/17/2021 Reason for consult: Initial assessment;1st time breastfeeding;Mother's request;Early term 37-38.6wks Age:31 hours  LC in to room per mother's request. Mother states she wants to breastfeed this child but was unsure about her medications. Mother is taking Keppra 750mg  twice a day; and, Flexeril 10mg  twice a day .  LC contacted [facility] pharmacy and was informed Keppra is safe for breastfeeding (L2-per Hale's book) but Flexeril may cause some drowsiness/sedation among others. Mother shares she is no longer taking flexeril.  Mother seems a little tired and sleepy at the moment, declines latching and other lactation teaching. Mother would like to be seen on 5/12.  LC set up DEBP and encouraged mother to contact RN or LC prior to use.    Maternal Data Has patient been taught Hand Expression?: No Does the patient have breastfeeding experience prior to this delivery?: No  Feeding Mother's Current Feeding Choice: Breast Milk and Formula Nipple Type: Slow - flow  LATCH Score Latch: Grasps breast easily, tongue down, lips flanged, rhythmical sucking.  Audible Swallowing: Spontaneous and intermittent  Type of Nipple: Everted at rest and after stimulation  Comfort (Breast/Nipple): Soft / non-tender  Hold (Positioning): Assistance needed to correctly position infant at breast and maintain latch.  LATCH Score: 9   Lactation Tools Discussed/Used Tools: Pump;Flanges Flange Size: 24 Breast pump type: Double-Electric Breast Pump Pump Education: Setup, frequency, and cleaning;Milk Storage Reason for Pumping: Mother's request Pumping frequency: as needed Pumped volume:  (has not started yet)  Interventions Interventions: Assisted with latch;Breast massage;Adjust position;Position options  Discharge    Consult Status Consult Status: Follow-up Date:  02/17/21 Follow-up type: In-patient    Kealani Leckey A Higuera Ancidey 02/17/2021, 12:42 AM

## 2021-02-17 NOTE — Progress Notes (Signed)
Post Partum Day 1 Subjective: headache body ache  Objective: Blood pressure (!) 97/58, pulse 92, temperature 97.9 F (36.6 C), temperature source Oral, resp. rate 18, height 5' (1.524 m), weight 81.6 kg, last menstrual period 05/20/2020, SpO2 97 %, unknown if currently breastfeeding.  Physical Exam:  General: alert, cooperative and no distress Lochia: appropriate Uterine Fundus: firm DVT Evaluation: No evidence of DVT seen on physical exam.  Recent Labs    02/16/21 0758 02/16/21 2000  HGB 13.8 11.5*  HCT 41.7 33.9*    Assessment/Plan:  Keppra for seizure control and neuro consult is requested  LOS: 1 day   Scheryl Darter 02/17/2021, 7:57 AM

## 2021-02-17 NOTE — Plan of Care (Signed)
  Problem: Education: Goal: Knowledge of General Education information will improve Description: Including pain rating scale, medication(s)/side effects and non-pharmacologic comfort measures Outcome: Progressing   Problem: Clinical Measurements: Goal: Ability to maintain clinical measurements within normal limits will improve Outcome: Progressing Goal: Respiratory complications will improve Outcome: Progressing   Problem: Activity: Goal: Risk for activity intolerance will decrease Outcome: Progressing   Problem: Nutrition: Goal: Adequate nutrition will be maintained Outcome: Progressing   Problem: Pain Managment: Goal: General experience of comfort will improve Outcome: Progressing   Problem: Safety: Goal: Ability to remain free from injury will improve Outcome: Progressing   Problem: Activity: Goal: Ability to tolerate increased activity will improve Outcome: Progressing

## 2021-02-17 NOTE — Lactation Note (Signed)
This note was copied from a baby's chart. Lactation Consultation Note  Patient Name: Jordan Blair GOTLX'B Date: 02/17/2021 Reason for consult: Follow-up assessment;Early term 37-38.6wks;Other (Comment) (Maternal seizure disorder) Age:31 hours  LC in to visit with P3 Mom of ET infant.  Mom had a grand mal seizure after delivery, but is stable though sedated from medication.  Baby went to CN overnight due to Mom being alone and sedated.  Baby breast fed once and has been fed by bottle while separated from Mom.   Mom desires to breast and formula feed.  Mom has not pumped, though pump set up in room.  Mom concerned as pumping was painful with last baby.  Mom called for baby in CN.  Mom said that baby latches easily and that isn't a problem.  Mom knows to ask for help prn.  Encouraged Mom to place baby STS and breastfeed with cues.    RN consulted as concern about Mom's sleepiness without a support person there.   Lactation Tools Discussed/Used Tools: Pump;Bottle Breast pump type: Double-Electric Breast Pump Reason for Pumping: support milk supply Pumping frequency: 0 Pumped volume: 0 mL  Interventions Interventions: Breast feeding basics reviewed;Skin to skin;Breast massage;Hand express;Education;DEBP   Consult Status Consult Status: Follow-up Date: 02/18/21 Follow-up type: In-patient    Jordan Blair 02/17/2021, 10:30 AM

## 2021-02-17 NOTE — Significant Event (Signed)
Rapid Response Event Note   Reason for Call :  RRT page received at 1947 d/t pt seizing post delivery. While on way to room, Code Blue was called (at 1950).  Initial Focused Assessment:  Pt lying in bed with eyes closed. Pt being bagged on 100% via BVM d/t dropping SpO2-40s and HR-40s after having witnessed seized post delivery. Pulse was never lost t/o event.  During assessment, pt became alert and oriented, though groggy, and was able to follow commands. BUE strength equal. Pt placed on 4L [place].   HR-109(ST), RR-18, SpO2-98% on 4LNC.  Interventions:  Code Blue called, pt bagged  Plan of Care:  Pt awake, alert and oriented, still groggy. VSS. Continue to monitor closely. Call RRT if further assistance needed.   Event Summary:   MD Notified: Dr. Debroah Loop present during event. Call Time:1947 Arrival [id number] End Time:2005 Terrilyn Saver, RN

## 2021-02-18 DIAGNOSIS — Z30017 Encounter for initial prescription of implantable subdermal contraceptive: Secondary | ICD-10-CM

## 2021-02-18 MED ORDER — LIDOCAINE HCL 1 % IJ SOLN
0.0000 mL | Freq: Once | INTRAMUSCULAR | Status: DC | PRN
  Filled 2021-02-18 (×2): qty 20

## 2021-02-18 MED ORDER — ETONOGESTREL 68 MG ~~LOC~~ IMPL
68.0000 mg | DRUG_IMPLANT | Freq: Once | SUBCUTANEOUS | Status: AC
  Administered 2021-02-18: 68 mg via SUBCUTANEOUS
  Filled 2021-02-18 (×2): qty 1

## 2021-02-18 MED ORDER — IBUPROFEN 600 MG PO TABS: 600.0000 mg | ORAL_TABLET | Freq: Four times a day (QID) | ORAL | 0 refills | Status: DC

## 2021-02-18 MED ORDER — PRENATAL MULTIVITAMIN CH: 1.0000 | ORAL_TABLET | Freq: Every day | ORAL | 11 refills | Status: DC

## 2021-02-18 NOTE — Lactation Note (Signed)
This note was copied from a baby's chart. Lactation Consultation Note  Patient Name: Jordan Blair Forquer [id number] Date: 02/18/2021 Reason for consult: Follow-up assessment;Mother's request;Early term 37-38.6wks Age:31 hours   LC contacted by RN to assist Mom getting a [facility] loaner prior to discharge. LC reviewed with Mom process, on arrival with the forms to complete. Mom stated she decided to get a electric pump from the store on the way home. Mom declined processing the electric pump on discharge.   LC gave Mom a manual pump and instructed her to use it after latching q 3 hrs on each breasts for 10 minutes.  Mom to follow up with [facility] office for [facility] support.  [facility] brochure of inpatient and outpatient services provided.  All questions answered at the end of the visit.   Maternal Data    Feeding Mother's Current Feeding Choice: Breast Milk and Formula Nipple Type: Slow - flow (Simultaneous filing. User may not have seen previous data.)  LATCH Score                    Lactation Tools Discussed/Used    Interventions Interventions: Breast feeding basics reviewed;Support pillows;Education;Hand pump;Hand express;Expressed milk  Discharge Discharge Education: Engorgement and breast care;Warning signs for feeding baby;Outpatient recommendation Pump: Manual  Consult Status Consult Status: Complete Date: 02/18/21 Follow-up type: In-patient    Tuesday Terlecki  Nicholson-Springer 02/18/2021, 7:51 PM

## 2021-02-18 NOTE — Discharge Instructions (Signed)

## 2021-02-18 NOTE — Progress Notes (Signed)
Patient discharged home with baby and her mother.  Questions asked and answered.  To car via wheel chair escorted by NT K. McCloud.  Baby in car seat by grandmother.

## 2021-02-18 NOTE — Progress Notes (Signed)
Patient ID: Jordan Blair, female   DOB: 1991-09-10, 30 y.o.   MRN: [id number]  Nexplanon Insertion Procedure Patient identified, informed consent performed, consent signed.   Patient does understand that irregular bleeding is a very common side effect of this medication. She was advised to have backup contraception for one week after placement. Pregnancy test in clinic today was negative.  Appropriate time out taken.  Patient's left arm was prepped and draped in the usual sterile fashion.. The ruler used to measure and mark insertion area.  Patient was prepped with alcohol swab and then injected with 3 ml of 1% lidocaine.  She was prepped with betadine, Nexplanon removed from packaging,  Device confirmed in needle, then inserted full length of needle and withdrawn per handbook instructions. Nexplanon was able to palpated in the patient's arm; patient palpated the insert herself. There was minimal blood loss.  Patient insertion site covered with guaze and a pressure bandage to reduce any bruising.  The patient tolerated the procedure well and was given post procedure instructions.      Hermina Staggers, MD, FACOG Attending Obstetrician & Gynecologist Center for [facility], [facility]

## 2021-02-18 NOTE — Plan of Care (Signed)
  Problem: Education: Goal: Knowledge of General Education information will improve Description: Including pain rating scale, medication(s)/side effects and non-pharmacologic comfort measures Outcome: Completed/Met   Problem: Health Behavior/Discharge Planning: Goal: Ability to manage health-related needs will improve Outcome: Completed/Met   Problem: Clinical Measurements: Goal: Ability to maintain clinical measurements within normal limits will improve Outcome: Completed/Met Goal: Will remain free from infection Outcome: Completed/Met Goal: Diagnostic test results will improve Outcome: Completed/Met Goal: Respiratory complications will improve Outcome: Completed/Met Goal: Cardiovascular complication will be avoided Outcome: Completed/Met   Problem: Activity: Goal: Risk for activity intolerance will decrease Outcome: Completed/Met   Problem: Nutrition: Goal: Adequate nutrition will be maintained Outcome: Completed/Met   Problem: Coping: Goal: Level of anxiety will decrease Outcome: Completed/Met   Problem: Elimination: Goal: Will not experience complications related to bowel motility Outcome: Completed/Met Goal: Will not experience complications related to urinary retention Outcome: Completed/Met   Problem: Pain Managment: Goal: General experience of comfort will improve Outcome: Completed/Met   Problem: Safety: Goal: Ability to remain free from injury will improve Outcome: Completed/Met   Problem: Skin Integrity: Goal: Risk for impaired skin integrity will decrease Outcome: Completed/Met   Problem: Education: Goal: Knowledge of condition will improve Outcome: Completed/Met Goal: Individualized Educational Video(s) Outcome: Completed/Met Goal: Individualized Newborn Educational Video(s) Outcome: Completed/Met   Problem: Activity: Goal: Will verbalize the importance of balancing activity with adequate rest periods Outcome: Completed/Met Goal: Ability to  tolerate increased activity will improve Outcome: Completed/Met   Problem: Coping: Goal: Ability to identify and utilize available resources and services will improve Outcome: Completed/Met   Problem: Life Cycle: Goal: Chance of risk for complications during the postpartum period will decrease Outcome: Completed/Met   Problem: Role Relationship: Goal: Ability to demonstrate positive interaction with newborn will improve Outcome: Completed/Met   Problem: Skin Integrity: Goal: Demonstration of wound healing without infection will improve Outcome: Completed/Met   Problem: Education: Goal: Knowledge of General Education information will improve Description: Including pain rating scale, medication(s)/side effects and non-pharmacologic comfort measures Outcome: Completed/Met   Problem: Health Behavior/Discharge Planning: Goal: Ability to manage health-related needs will improve Outcome: Completed/Met   Problem: Clinical Measurements: Goal: Ability to maintain clinical measurements within normal limits will improve Outcome: Completed/Met Goal: Will remain free from infection Outcome: Completed/Met Goal: Diagnostic test results will improve Outcome: Completed/Met Goal: Respiratory complications will improve Outcome: Completed/Met Goal: Cardiovascular complication will be avoided Outcome: Completed/Met   Problem: Activity: Goal: Risk for activity intolerance will decrease Outcome: Completed/Met   Problem: Nutrition: Goal: Adequate nutrition will be maintained Outcome: Completed/Met   Problem: Coping: Goal: Level of anxiety will decrease Outcome: Completed/Met   Problem: Elimination: Goal: Will not experience complications related to bowel motility Outcome: Completed/Met Goal: Will not experience complications related to urinary retention Outcome: Completed/Met   Problem: Pain Managment: Goal: General experience of comfort will improve Outcome: Completed/Met    Problem: Safety: Goal: Ability to remain free from injury will improve Outcome: Completed/Met   Problem: Skin Integrity: Goal: Risk for impaired skin integrity will decrease Outcome: Completed/Met

## 2021-02-18 NOTE — Lactation Note (Signed)
This note was copied from a baby's chart. Lactation Consultation Note Mother sleeping soundly and infant in nursery. LC spoke with RN who will call LC if lactation consult is needed today. No charge.  Patient Name: Jordan Blair Date: 02/18/2021    Elder Negus, MA IBCLC 02/18/2021, 8:21 AM

## 2021-02-18 NOTE — Discharge Summary (Signed)
Postpartum Discharge Summary  Date of Service updated 02/18/21     Patient Name: Jordan Blair DOB: 1991/03/02 MRN: [id number]  Date of admission: 02/16/2021 Delivery date:02/16/2021  Delivering provider: [place] Mode  Date of discharge: 02/18/2021  Admitting diagnosis: Supervision of low-risk pregnancy, third trimester [Z34.93] Intrauterine pregnancy: [redacted]w[redacted]d    Secondary diagnosis:  Active Problems:   Supervision of low-risk pregnancy, third trimester  Additional problems: seizure disorder during pregnancy    Discharge diagnosis: Term Pregnancy Delivered and Seizure disorder                                              Post partum procedures:Nexplanon insertion on 02/18/2021 Augmentation: Pitocin Complications: Maternal Seizure  Hospital course: Onset of Labor With Vaginal Delivery      30y.o. yo [phone] 328w6das admitted in Active Labor on 02/16/2021. Patient had an uncomplicated labor course as follows:  Membrane Rupture Time/Date: 9:14 AM ,02/16/2021   Delivery Method:Vaginal, Vacuum (Extractor)  Episiotomy: None  Lacerations:  None   Shortly after delivery pt had a seizure. Managed with Ativan. Spoke with Neurology and as this felt to be due to pt's non compliance not additional work needed. Pt was restarted on her Keppra. Importance of taking her medication and keeping follow up appts reviewed with pt. Pt also advised on no driving x 6 months and not to be alone with infant, all due to her seizure disorder. Pt other wise progressed to ambulating, voiding, tolerating diet and good oreal pain control. Nexplanon was inserted in pt's left arm on day of discharge. See procedure note for additional information  Patient is discharged home in stable condition on 02/18/21.  Newborn Data: Birth date:02/16/2021  Birth time:7:40 PM  Gender:Female  Living status:Living  Apgars:3 ,7  Weight:2855 g   Magnesium Sulfate received: No BMZ received:  No Rhophylac:No MMR:No T-DaP:Given prenatally Flu: Yes Transfusion:No  Physical exam  Vitals:   02/17/21 2336 02/18/21 0000 02/18/21 0330 02/18/21 1141  BP: 129/69  (!) 102/56 113/81  Pulse: 82  79 87  Resp: 18  18 18   Temp: 98.5 F (36.9 C)  98.2 F (36.8 C) 98.5 F (36.9 C)  TempSrc: Oral  Oral Oral  SpO2: 99% 95% 99% 99%  Weight:      Height:       General: alert Lochia: appropriate Uterine Fundus: firm Incision: Healing well with no significant drainage DVT Evaluation: No evidence of DVT seen on physical exam. Labs: Lab Results  Component Value Date   WBC 23.2 (H) 02/16/2021   HGB 11.5 (L) 02/16/2021   HCT 33.9 (L) 02/16/2021   MCV 87.6 02/16/2021   PLT 233 02/16/2021   CMP Latest Ref Rng & Units 02/16/2021  Glucose 70 - 99 mg/dL 75  BUN 6 - 20 mg/dL 7  Creatinine 0.44 - 1.00 mg/dL 0.92  Sodium 135 - 145 mmol/L 132(L)  Potassium 3.5 - 5.1 mmol/L 3.7  Chloride 98 - 111 mmol/L 101  CO2 22 - 32 mmol/L 21(L)  Calcium 8.9 - 10.3 mg/dL 8.6(L)  Total Protein 6.5 - 8.1 g/dL 5.6(L)  Total Bilirubin 0.3 - 1.2 mg/dL 0.6  Alkaline Phos 38 - 126 U/L 134(H)  AST 15 - 41 U/L 26  ALT 0 - 44 U/L 16   Edinburgh Score: No flowsheet data found.   After visit meds:  Allergies as of 02/18/2021      Reactions   Amoxicillin    Other reaction(s): GI Upset (intolerance)      Medication List    STOP taking these medications   cyclobenzaprine 10 MG tablet Commonly known as: FLEXERIL   folic acid 1 MG tablet Commonly known as: FOLVITE   pantoprazole 40 MG tablet Commonly known as: Protonix   PRENATAL VITAMINS PO     TAKE these medications   ibuprofen 600 MG tablet Commonly known as: ADVIL Take 1 tablet (600 mg total) by mouth every 6 (six) hours.   levETIRAcetam 750 MG tablet Commonly known as: KEPPRA Take 1 tablet (750 mg total) by mouth 2 (two) times daily.   prenatal multivitamin Tabs tablet Take 1 tablet by mouth daily at 12 noon. Start taking on: Feb 19, 2021        Discharge home in stable condition Infant Feeding: No evidence of DVT seen on physical exam. Infant Disposition:rooming in Discharge instruction: per After Visit Summary and Postpartum booklet. Activity: Advance as tolerated. Pelvic rest for 6 weeks.  Diet: routine diet Future Appointments: Future Appointments  Date Time Provider [place]  03/23/2021  2:30 PM Truett Mainland, DO [facility] None   Follow up Visit:  [place] Follow up.   Why: Pt already has appt on June 15 Contact information: [address], [place] [address] [phone]              Message sent to [facility] on 02/18/21:  Please schedule this patient for a In person postpartum visit in 6 weeks with the following provider: MD. Additional Postpartum F/U:none  High risk pregnancy complicated by: seizure disorder, maternal seizure following delivery Delivery mode:  Vaginal, Vacuum (Extractor)  Anticipated Birth Control:  Nexplanon   02/18/2021 Chancy Milroy, MD

## 2021-02-18 NOTE — Progress Notes (Signed)
CSW received consult for hx of Anxiety and Depression.  CSW met with MOB to offer support and complete assessment.    CSW met with MOB at bedside and introduced self/explained role. MOB was laying down in bed. MOB was welcoming, open and remained engaged during assessment. CSW and MOB discussed MOB's mental health history. MOB reported that she has experiencing anxiety and depression for the past 10 years on and off. MOB reported that her anxiety and depression has been mangable without medication. MOB reported that she has been to counseling in the past, noting she last participated in counseling in February 2022 at [facility]. MOB reported that counseling was helpful and she plans to resume at the same place. MOB denied and additional mental health history. MOB denied any history of postpartum depression. CSW inquired about how MOB was feeling emotionally after giving birth, MOB reported that today she woke up sad and crying, noting that she had to pray. MOB shared about her birthing experience, having a seizure after giving birth. MOB reported that she is not typically emotionally. CSW acknowledged and validated MOB's feelings surrounding her experience. CSW discussed how overwhelming a traumatic birthing experience can be and the emotions associated with it. CSW encouraged MOB to contact her counselor as soon as possible to ensure getting a timely appointment, MOB agreed. CSW inquired about MOB's coping skills, MOB reported that listening to music specifically (sleep and meditation music), noting it's calming. CSW inquired about MOB's support system, MOB reported that her mom, aunt and family are supports. MOB reported that she has all items needed to care for infant including a car seat and crib. CSW asked if there was anything CSW could do to be helpful, MOB reported no needs. MOB presented calm and did not demonstrate any acute mental health signs/symptoms. CSW assessed for safety, MOB  denied SI, HI and domestic violence.   CSW provided education regarding the baby blues period vs. perinatal mood disorders, discussed treatment and gave resources for mental health follow up if concerns arise.  CSW recommends self-evaluation during the postpartum time period using the New Mom Checklist from Postpartum Progress and encouraged MOB to contact a medical professional if symptoms are noted at any time.    CSW provided review of Sudden Infant Death Syndrome (SIDS) precautions.    CSW identifies no further need for intervention and no barriers to discharge at this time.  Abundio Miu, [place] Worker [facility] Cell#: [phone]

## 2021-02-18 NOTE — Lactation Note (Signed)
This note was copied from a baby's chart. Lactation Consultation Note  Patient Name: Jordan Blair Date: 02/18/2021 Reason for consult: Follow-up assessment;Mother's request;Difficult latch;Early term 37-38.6wks;Other (Comment) (Seizure disorder) Mom on levETIRAcetam (KEPPRA) tablet 750 mg bid. ( medication in [employer]act[employer] might reduce milk supply in some women)  Age:31 hours On arrival, Mom had infant latched but was using a pillow to support the head, back and shoulders. LC assisted Mom in placing infant in football, flanging out lips and assuring lips and cheeks were touching. Mom stated she did not have any discomfort with latch. Infant feeding well with signs of milk transfer.   LC encouraged Mom to pump q3 hrs using dEPB for 15 minutes after latching to maintain her milk supply. LC assessed flange size of 24 while pumping and Mom stated it was comfortable fit. RN to provide coconut oil for nipple care and Mom can use it to line the flanges before pumping.   Plan 1. To feed based on cues 8-12x in 24 hr period no more than 3 hrs without an attempt. Mom to offer both breasts and look for swallows with breast compression.            2. Mom to paced bottle feed with EBM first then formula according to breastfeeding supplementation guide. Mom aware if infant not latching at the breast to offer more via bottle of EBM/formula.           3. Mom to pump as stated above to maintain her milk supply.  Mom stated uterine cramping with the latch. LC applied heat and contacted RN for further assistance to address her pain level.  All questions answered at the end of the visit.  Feeding plan above shared with RN, Chiquasia Morris.  Maternal Data    Feeding Mother's Current Feeding Choice: Breast Milk and Formula Nipple Type: Slow - flow  LATCH Score Latch: Repeated attempts needed to sustain latch, nipple held in mouth throughout feeding, stimulation needed to elicit sucking  reflex.  Audible Swallowing: Spontaneous and intermittent  Type of Nipple: Everted at rest and after stimulation  Comfort (Breast/Nipple): Soft / non-tender  Hold (Positioning): Assistance needed to correctly position infant at breast and maintain latch.  LATCH Score: 8   Lactation Tools Discussed/Used    Interventions Interventions: Breast feeding basics reviewed;Breast compression;Assisted with latch;Adjust position;Skin to skin;Support pillows;Hand express;Expressed milk;Education;Coconut oil;DEBP  Discharge    Consult Status Consult Status: Follow-up Date: 02/19/21 Follow-up type: In-patient    Sophi Calligan  Nicholson-Springer 02/18/2021, 1:18 PM

## 2021-02-21 LAB — SURGICAL PATHOLOGY

## 2021-02-23 ENCOUNTER — Encounter: Payer: Medicaid Other | Admitting: Family Medicine

## 2021-02-28 MED FILL — Medication: Qty: 1 | Status: AC

## 2021-03-09 ENCOUNTER — Other Ambulatory Visit: Payer: Self-pay | Admitting: Family Medicine

## 2021-03-09 MED ORDER — LEVETIRACETAM 750 MG PO TABS: 750.0000 mg | ORAL_TABLET | Freq: Two times a day (BID) | ORAL | 3 refills | Status: AC

## 2021-03-23 ENCOUNTER — Ambulatory Visit: Payer: Medicaid Other | Admitting: Family Medicine

## 2021-03-24 ENCOUNTER — Encounter: Payer: Medicaid Other | Admitting: Licensed Clinical Social Worker

## 2021-03-24 ENCOUNTER — Other Ambulatory Visit: Payer: Self-pay

## 2021-03-24 ENCOUNTER — Ambulatory Visit (INDEPENDENT_AMBULATORY_CARE_PROVIDER_SITE_OTHER): Payer: Medicaid Other | Admitting: Licensed Clinical Social Worker

## 2021-03-24 DIAGNOSIS — F53 Postpartum depression: Secondary | ICD-10-CM | POA: Diagnosis not present

## 2021-03-24 DIAGNOSIS — O99345 Other mental disorders complicating the puerperium: Secondary | ICD-10-CM | POA: Diagnosis not present

## 2021-03-24 NOTE — BH Specialist Note (Signed)
Integrated Behavioral Health via Telemedicine Visit  03/24/2021 Canda Podgorski [id number]  Number of Integrated Behavioral Health visits: 1 Session Start time: 12:44pm  Session End time: 1:10pm Total time: 26 mins via phone   Referring Provider: self referral  Patient/Family location: home  [facility] Provider location: [place] All persons participating in visit: Pt C Wood and LCSWA A Byrdie Miyazaki  Types of Service: Individual psychotherapy  I connected with Cerys Haeberle and/or [employer] n/a via  Telephone or Engineer, civil (consulting)  (Video is Surveyor, mining) and verified that I am speaking with the correct person using two identifiers. Discussed confidentiality: Yes   I discussed the limitations of telemedicine and the availability of in person appointments.  Discussed there is a possibility of technology failure and discussed alternative modes of communication if that failure occurs.  I discussed that engaging in this telemedicine visit, they consent to the provision of behavioral healthcare and the services will be billed under their insurance.  Patient and/or legal guardian expressed understanding and consented to Telemedicine visit: Yes   Presenting Concerns: Patient and/or family reports the following symptoms/concerns: postpartum depression  Duration of problem: 3 weeks; Severity of problem: mild  Patient and/or Family's Strengths/Protective Factors: Concrete supports in place (healthy food, safe environments, etc.)  Goals Addressed: Patient will:  Reduce symptoms of: depression   Increase knowledge and/or ability of: coping skills and healthy habits   Demonstrate ability to: Increase adequate support systems for patient/family  Progress towards Goals: Ongoing  Interventions: Interventions utilized:  Supportive Counseling Standardized Assessments completed: Not Needed   Assessment: Patient currently experiencing postpartum depression .    Patient may benefit from integrated behavioral health .  Plan: Follow up with behavioral health clinician on : 3 weeks via mychart  Behavioral recommendations: Consult with physician regarding prescribed medication concerns, communicate needs with family to decrease social isolation, engage in bonding activities with newborn  Referral(s): n/a  I discussed the assessment and treatment plan with the patient and/or parent/guardian. They were provided an opportunity to ask questions and all were answered. They agreed with the plan and demonstrated an understanding of the instructions.   They were advised to call back or seek an in-person evaluation if the symptoms worsen or if the condition fails to improve as anticipated.  Gwyndolyn Saxon, LCSW

## 2021-04-15 ENCOUNTER — Encounter: Payer: Self-pay | Admitting: Family Medicine

## 2021-04-15 ENCOUNTER — Other Ambulatory Visit (HOSPITAL_COMMUNITY)
Admission: RE | Admit: 2021-04-15 | Discharge: 2021-04-15 | Disposition: A | Discharge status: Home / Self Care | Payer: Medicaid Other | Source: Ambulatory Visit | Attending: Family Medicine | Admitting: Family Medicine

## 2021-04-15 ENCOUNTER — Encounter: Payer: Self-pay | Admitting: General Practice

## 2021-04-15 ENCOUNTER — Ambulatory Visit (INDEPENDENT_AMBULATORY_CARE_PROVIDER_SITE_OTHER): Payer: Medicaid Other | Admitting: Family Medicine

## 2021-04-15 VITALS — BP 128/86 | HR 85 | Resp 16 | Ht 61.0 in | Wt 153.0 lb

## 2021-04-15 DIAGNOSIS — Z3202 Encounter for pregnancy test, result negative: Secondary | ICD-10-CM | POA: Diagnosis not present

## 2021-04-15 DIAGNOSIS — R87613 High grade squamous intraepithelial lesion on cytologic smear of cervix (HGSIL): Secondary | ICD-10-CM | POA: Insufficient documentation

## 2021-04-15 LAB — POCT URINE PREGNANCY: Preg Test, Ur: NEGATIVE

## 2021-04-15 NOTE — Progress Notes (Signed)
Patient Name: Jordan Blair, female   DOB: 05-09-1991, 30 y.o.  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    Exam: Vulva and Vagina grossly normal.  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  Will call patient with results.

## 2021-04-19 LAB — SURGICAL PATHOLOGY

## 2021-04-21 ENCOUNTER — Ambulatory Visit: Payer: Medicaid Other | Admitting: Family Medicine

## 2021-04-23 ENCOUNTER — Ambulatory Visit (HOSPITAL_BASED_OUTPATIENT_CLINIC_OR_DEPARTMENT_OTHER): Payer: Medicaid Other

## 2021-05-02 ENCOUNTER — Ambulatory Visit (HOSPITAL_BASED_OUTPATIENT_CLINIC_OR_DEPARTMENT_OTHER)
Admission: RE | Admit: 2021-05-02 | Discharge: 2021-05-02 | Disposition: A | Discharge status: Home / Self Care | Payer: Medicaid Other | Source: Ambulatory Visit | Attending: Family Medicine | Admitting: Family Medicine

## 2021-05-02 ENCOUNTER — Ambulatory Visit (HOSPITAL_BASED_OUTPATIENT_CLINIC_OR_DEPARTMENT_OTHER): Payer: Medicaid Other

## 2021-05-02 ENCOUNTER — Other Ambulatory Visit: Payer: Self-pay

## 2021-05-03 ENCOUNTER — Other Ambulatory Visit (HOSPITAL_BASED_OUTPATIENT_CLINIC_OR_DEPARTMENT_OTHER): Payer: Medicaid Other

## 2021-05-10 ENCOUNTER — Ambulatory Visit (INDEPENDENT_AMBULATORY_CARE_PROVIDER_SITE_OTHER): Payer: Medicaid Other

## 2021-05-10 ENCOUNTER — Encounter: Payer: Self-pay | Admitting: General Practice

## 2021-05-10 ENCOUNTER — Other Ambulatory Visit: Payer: Self-pay

## 2021-05-10 DIAGNOSIS — Z3046 Encounter for surveillance of implantable subdermal contraceptive: Secondary | ICD-10-CM

## 2021-05-10 DIAGNOSIS — Z309 Encounter for contraceptive management, unspecified: Secondary | ICD-10-CM

## 2021-05-10 DIAGNOSIS — F17200 Nicotine dependence, unspecified, uncomplicated: Secondary | ICD-10-CM | POA: Diagnosis not present

## 2021-05-10 NOTE — Progress Notes (Signed)
Post Partum Visit Note  Jordan Blair is a 30 y.o. [phone] female who presents for a postpartum visit. She is  11  weeks postpartum following a vacuum-assisted vaginal delivery.  I have fully reviewed the prenatal and intrapartum course. The delivery was at 38 gestational weeks.  Anesthesia: epidural. Postpartum course has been uncomplicated. Baby is doing well. Baby is feeding by bottle - Daron Offer Soothe Pro . Bleeding staining only. Bowel function is normal. Bladder function is normal. Patient is not sexually active. Contraception method is none. Postpartum depression screening: negative.  Patient reports she has Nexplanon in and she has been bleeding daily since placement.  She states that she fears the continued blood loss will trigger an epileptic seizure. She reports support from her mother and endorses safety at home.  She denies feelings of depression.   The pregnancy intention screening data noted above was reviewed. Potential methods of contraception were discussed. The patient elected to proceed with No data recorded.   Edinburgh Postnatal Depression Scale - 05/10/21 0919       Edinburgh Postnatal Depression Scale:  In the Past 7 Days   I have been able to laugh and see the funny side of things. 0    I have looked forward with enjoyment to things. 0    I have blamed myself unnecessarily when things went wrong. 1    I have been anxious or worried for no good reason. 2    I have felt scared or panicky for no good reason. 0    Things have been getting on top of me. 1    I have been so unhappy that I have had difficulty sleeping. 0    I have felt sad or miserable. 0    I have been so unhappy that I have been crying. 1    The thought of harming myself has occurred to me. 0    Edinburgh Postnatal Depression Scale Total 5             Health Maintenance Due  Topic Date Due   COVID-19 Vaccine (1) Never done   Pneumococcal Vaccine 48-85 Years old (1 - PCV) Never done    INFLUENZA VACCINE  05/09/2021    The following portions of the patient's history were reviewed and updated as appropriate: allergies, current medications, past family history, past medical history, past social history, past surgical history, and problem list.  Review of Systems Pertinent items noted in HPI and remainder of comprehensive ROS otherwise negative.  Objective:  BP 123/83   Pulse 86   Wt 155 lb (70.3 kg)   LMP 05/20/2020   BMI 29.29 kg/m    General:  alert, cooperative, and no distress   Breasts:  normal  Lungs: clear to auscultation bilaterally  Heart:  regular rate and rhythm  Abdomen: soft, non-tender; bowel sounds normal; no masses,  no organomegaly   Wound N/A  GU exam:  not indicated       Assessment:   11 week  postpartum exam.  Doing well Desires Nexplanon Removal H/O HSIL with HPV Plan:  Discussed recommendation for repeat colposcopy in one year per Dr. Manus Rudd. Briefly reviewed why said recommendation was made. Reviewed process of cervical change in relation to cervical CA. Encouraged smoking cessation to promote decrease of cervical changes. Information provided in AVS Reviewed concerns with Nexplanon placement. Will remove today per request.   Nexplanon Removal Procedure:  Jahnia Tigert requests removal of Nexplanon for prolonged bleeding.  She understands that without another form of birth control method pregnancy can ensue immediately after removal. Patient also understands that removal can result in blood loss, infection at removal site, and pain.  Patient verbalizes understanding for all risks as stated above and in the consent and desires to proceed with removal.   Appropriate time out taken.  Patient identified, informed consent performed, consent signed.  Nexplanon site identified; left arm.  Area prepped in usual sterile fashon. 27ml of 1% lidocaine with epi was used to anesthetize the area at the distal end of the implant. A small stab  incision was made right beside the implant on the distal portion.  The Nexplanon rod was visualized within the sheath. The sheath was gently removed with the scalpel, resulting in the Nexplanon ejecting from the site without difficulty. There was minimal blood loss and no complications.  Steri-strips were applied over the small incision.  Patient declined placement of pressure dressing and a band-aid was applied over the steri-strips.   The patient tolerated the procedure well and was given post procedure instructions to include: *Remove of pressure dressing and band-aid in 12-24 hours. *Remove of steri-strips after no more than 7 days. *Condom usage encouraged as patient does not desire contraception at this time. *Tylenol or ibuprofen for insertion pain/discomfort. *Call for any other questions, concerns, or complciations  Essential components of care per ACOG recommendations:  1.  Mood and well being: Patient with negative depression screening today. Reviewed local resources for support.  - Patient tobacco use? Yes. Patient desires to quit? Yes.Discussed reduction and cessation  - hx of drug use? No.    2. Infant care and feeding:  -Patient currently breastmilk feeding? No.  -Social determinants of health (SDOH) reviewed in EPIC. No concerns The following needs were identified  3. Sexuality, contraception and birth spacing - Patient does not want a pregnancy in the next year.  Desired family size is 3 children.  - Reviewed forms of contraception in tiered fashion. Patient desired no method today.   - Discussed birth spacing of 18 months  4. Sleep and fatigue -Encouraged family/partner/community support of 4 hrs of uninterrupted sleep to help with mood and fatigue  5. Physical Recovery  - Discussed patients delivery and complications. She describes her labor as good. - Patient had a Vaginal problems after delivery including epileptic . Patient had a  no  laceration. Perineal healing  reviewed. Patient expressed understanding - Patient has urinary incontinence? No. - Patient is safe to resume physical and sexual activity  6.  Health Maintenance - HM due items addressed Yes - Last pap smear  Diagnosis  Date Value Ref Range Status  07/16/2020 (A)  Final   - High grade squamous intraepithelial lesion (HSIL)   Pap smear not done at today's visit.  *Recommendation for repeat colposcopy  -Breast Cancer screening indicated? No.   7. Chronic Disease/Pregnancy Condition follow up: None - PCP follow up  Cherre Robins, CNM [facility] [employer]

## 2021-05-23 ENCOUNTER — Ambulatory Visit: Payer: Medicaid Other | Admitting: Obstetrics and Gynecology

## 2021-07-16 ENCOUNTER — Encounter: Payer: Self-pay | Admitting: Radiology

## 2022-03-07 IMAGING — US US OB COMP LESS 14 WK
1 series · 14 of 28 positions shown · non-contrast
Comparison: none

[Series 1: us ob comp less 14 wk · 33 acquisitions, 14 frames shown]
[im 2/33]
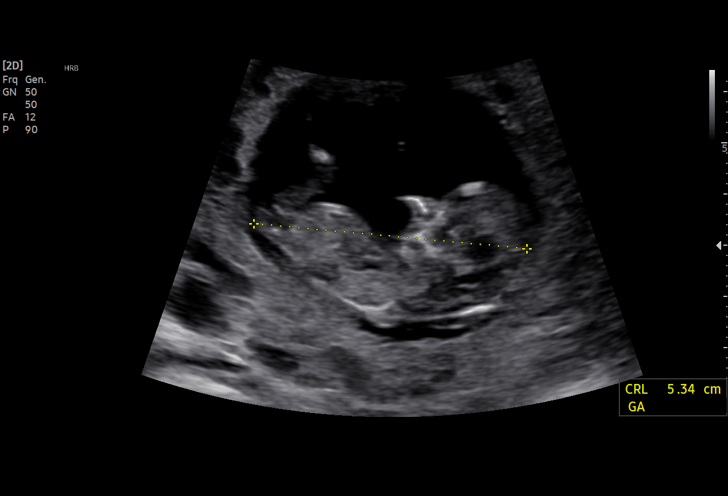
[im 4/33]
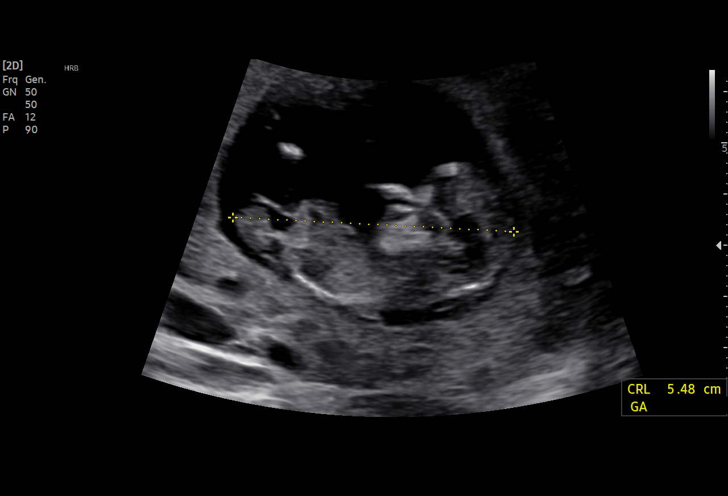
[im 6/33]
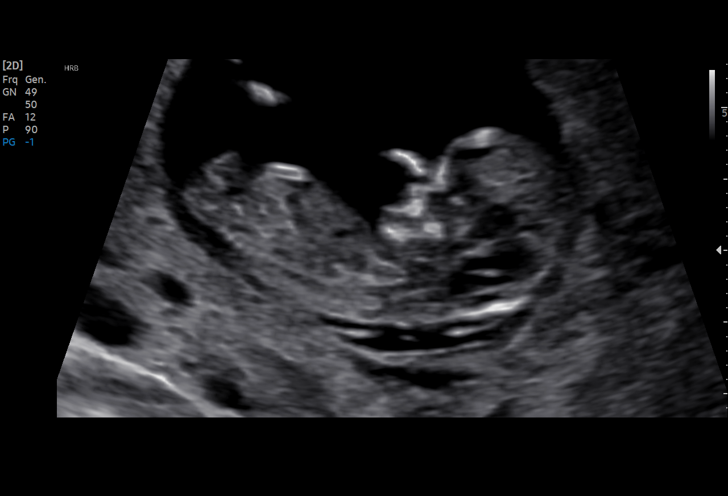
[im 9/33]
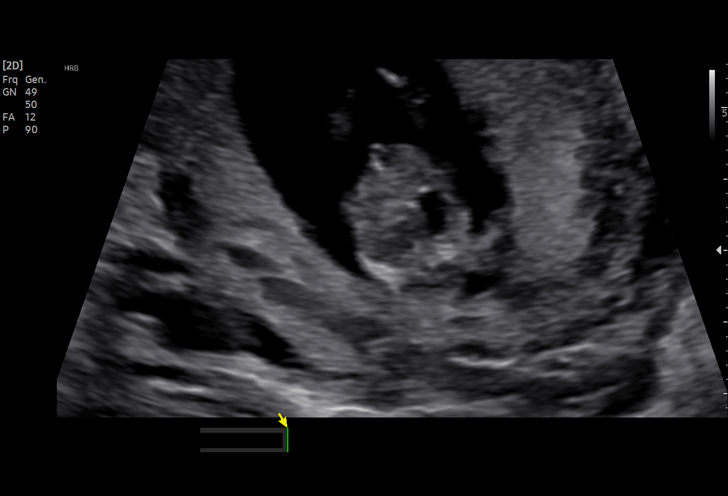
[im 11/33]
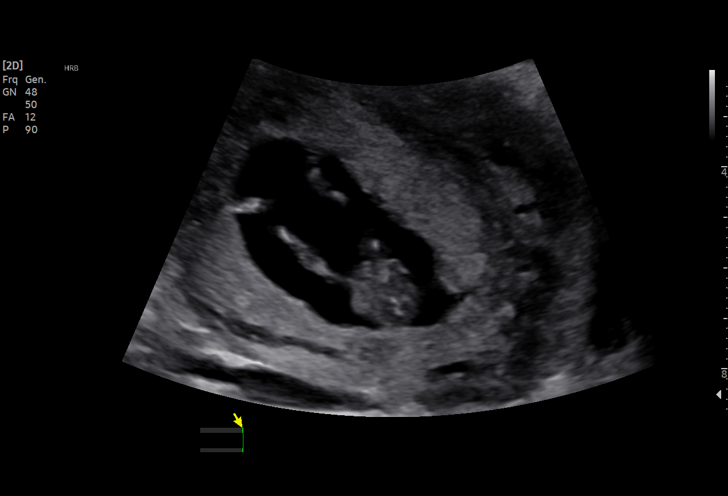
[im 14/33]
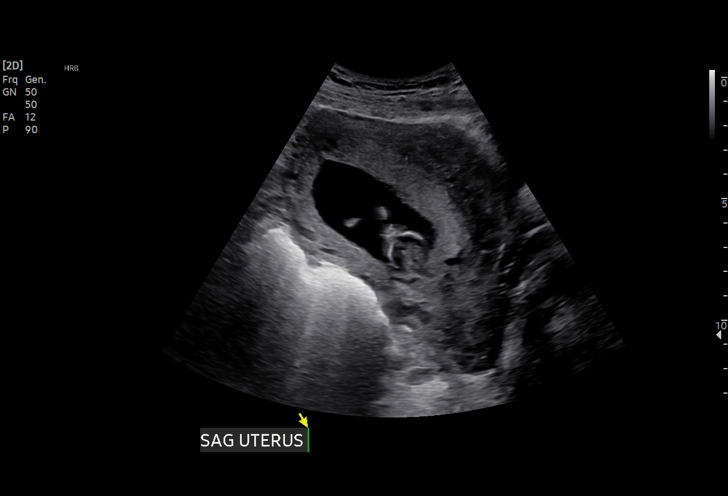
[im 16/33]
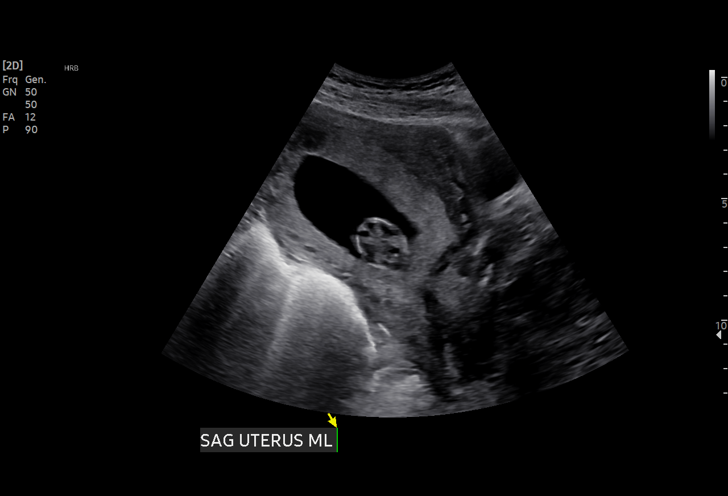
[im 18/33]
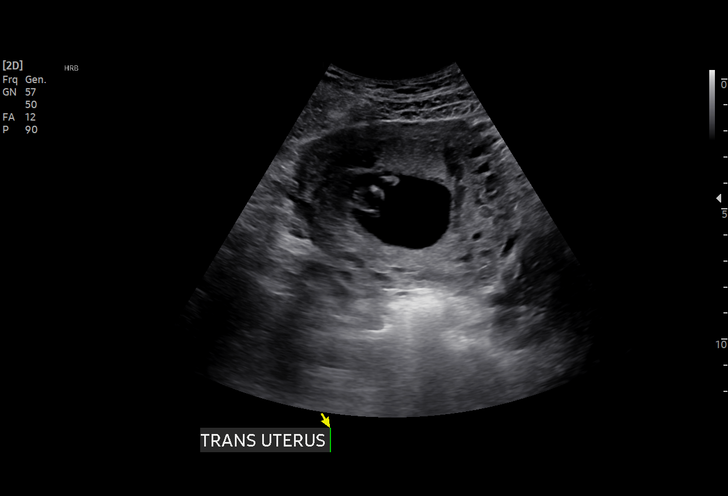
[im 21/33]
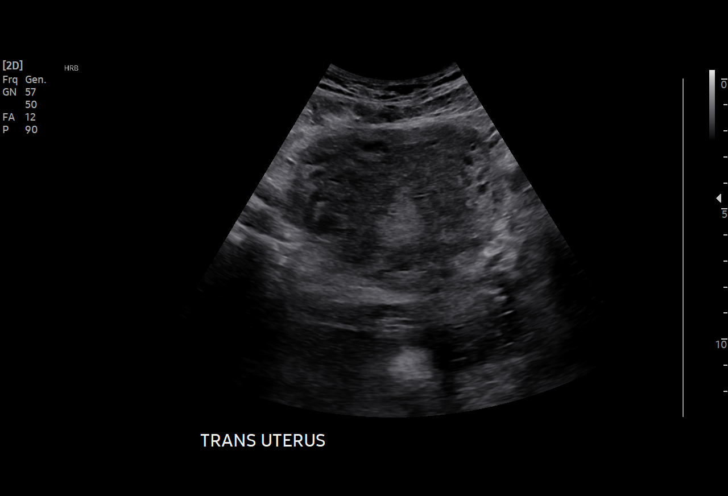
[im 23/33]
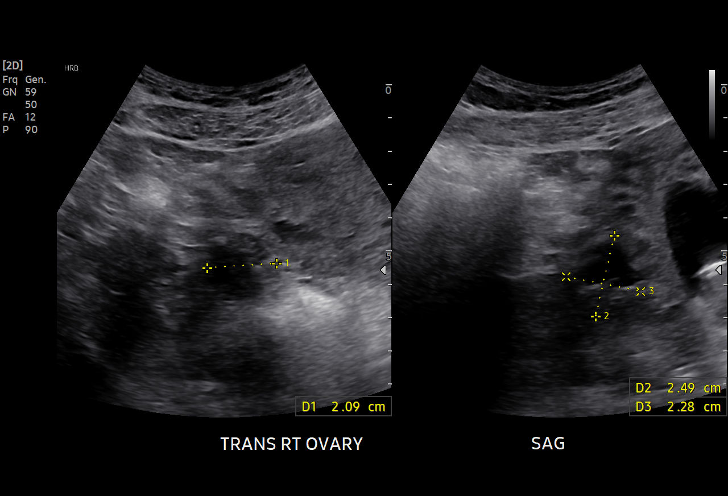
[im 25/33]
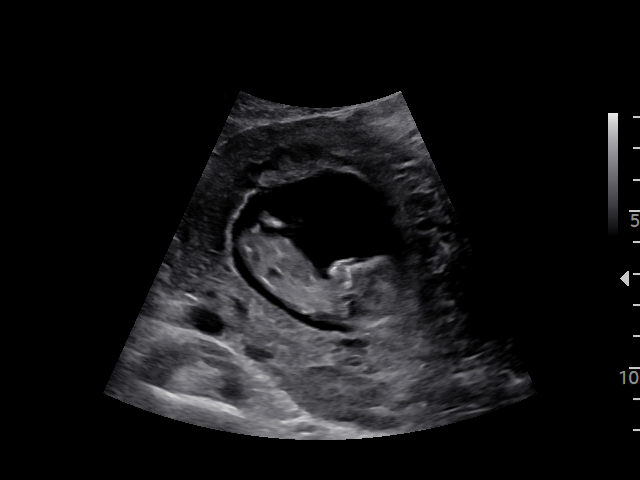
[im 28/33]
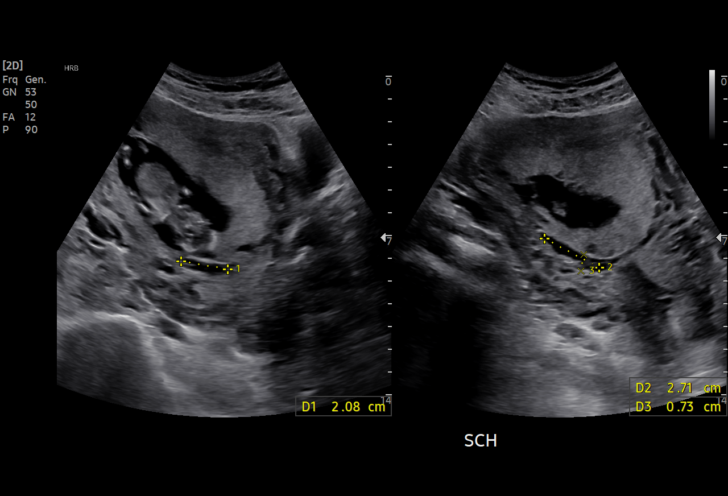
[im 30/33]
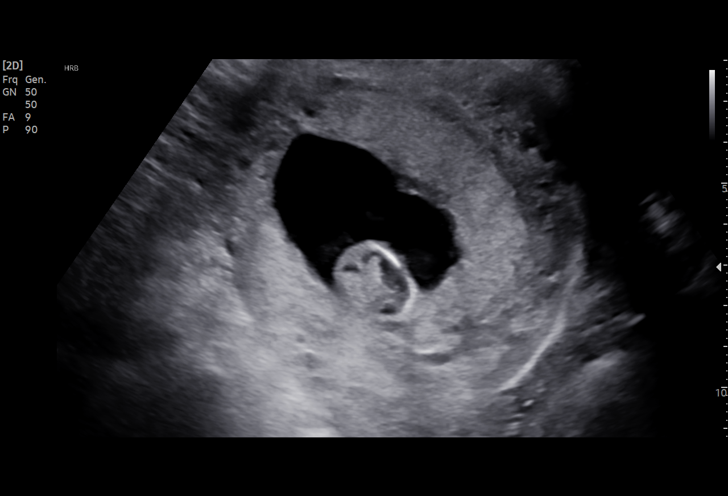
[im 33/33]
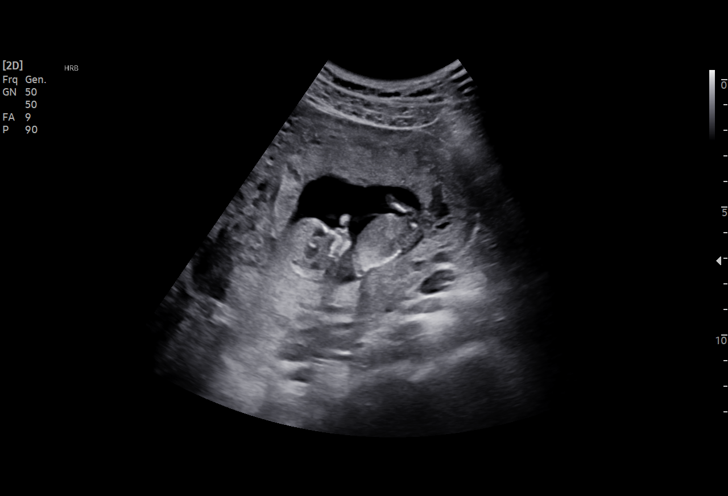

[14 of 28 positions shown; findings below may reference images not displayed]

ANATOMY

Indications

 Seizure disorder
 Encounter for antenatal screening for
 malformations
 Medication exposure during first trimester of
 pregnancy (trileptal)
 Poor obstetric history: Previous preterm
 delivery, antepartum (29w & 35w) PPROM x
 2
 Medical complication of pregnancy (hx TBI)
 12 weeks gestation of pregnancy
Fetal Evaluation

 Num Of Fetuses:         1
 Preg. Location:         Intrauterine
 Gest. Sac:              Intrauterine
 Yolk Sac:               Visualized
 Fetal Pole:             Visualized
 Fetal Heart Rate(bpm):  152
 Cardiac Activity:       Observed

 Amniotic Fluid
 AFI FV:      Within normal limits
Biometry
 CRL:      54.7  mm     G. Age:  12w 0d                  EDD:   02/25/21
OB History

 Gravidity:    3         Prem:   2
 Living:       2
Gestational Age

 LMP:           12w 1d        Date:  05/20/20                 EDD:   02/24/21
 Best:          12w 1d     Det. By:  LMP  (05/20/20)          EDD:   02/24/21
Anatomy

 Cranium:               Appears normal         Bladder:                Appears normal
 Stomach:               Appears normal, left   Upper Extremities:      Visualized
                        sided
 Cord Vessels:          Appears normal (3      Lower Extremities:      Visualized
                        vessel cord)
Cervix Uterus Adnexa

 Cervix
 Closed

 Uterus
 No abnormality visualized.

 Right Ovary
 Within normal limits.

 Left Ovary
 Not visualized. No adnexal mass visualized.

 Cul De Sac
 No free fluid seen.

 Adnexa
 No abnormality visualized.
Comments

 This patient was seen for a first trimester ultrasound as she
 has a history of a seizure disorder that had been treated with
 Trileptal.  She discontinued the medication when she found
 out that she was pregnant.

 The crown-rump length measured today is consistent with her
 gestational age.  Although limited due to her early gestational
 age, there were no obvious fetal anomalies noted on today's
 ultrasound exam.

 Based on [employer] database review published in 5798
 examining women who were exposed to Trileptal
 (oxcarbazepine) during pregnancy, the prevalence of major
 malformations was similar to that observed in women who
 were not exposed to the medication.  The patient was
 reassured by the findings of the study.

 Due to early exposure to Trileptal, a detailed fetal anatomy
 scan has been scheduled in our office at around 19 weeks.

 As she has not had any screening tests for fetal aneuploidy
 drawn in her current pregnancy, the patient had a cell free
 DNA test (Materni T21) drawn following today's ultrasound
 exam.  Our genetic counselor will notify the patient regarding
 the results of this test.

## 2022-06-11 IMAGING — US US ABDOMEN COMPLETE
1 series · 15 of 25 positions shown · non-contrast
Comparison: CT Abdomen and Pelvis [REDACTED] [REDACTED] 08/14/2018.

CLINICAL DATA: 29-year-old female with right lower quadrant pain in
the 2nd trimester of pregnancy.

EXAM:
ABDOMEN ULTRASOUND COMPLETE

[Series 1: us abdomen complete · 15 of 74 slices shown]
[im 1/74]
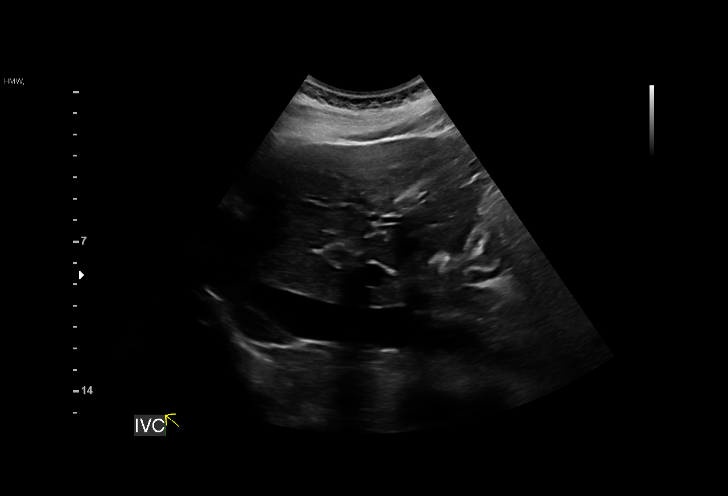
[im 7/74]
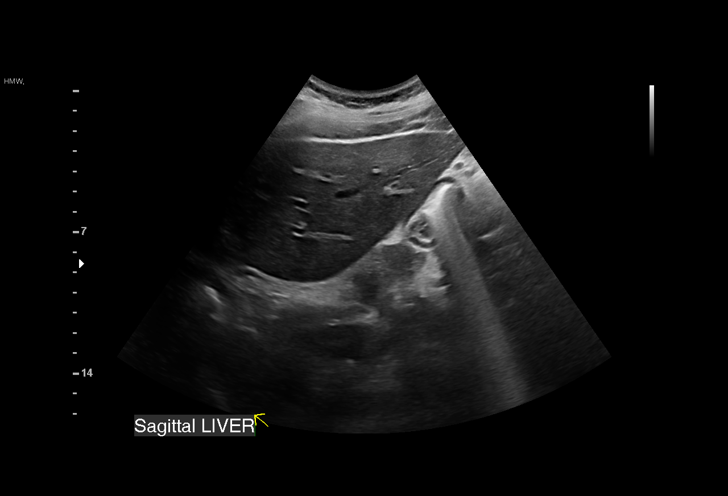
[im 13/74]
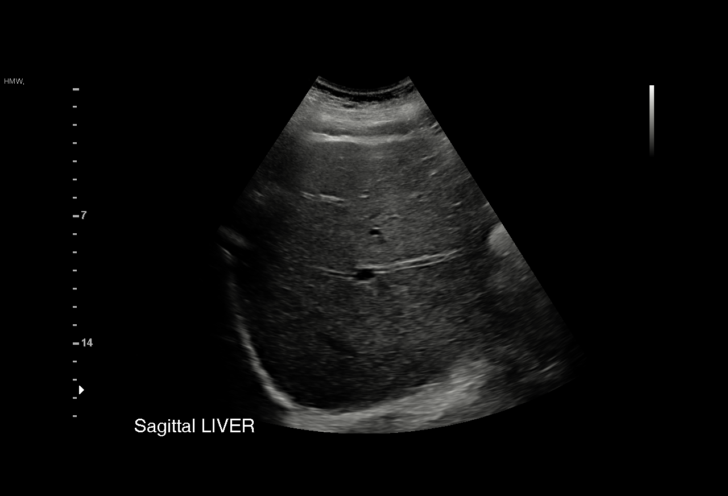
[im 16/74]
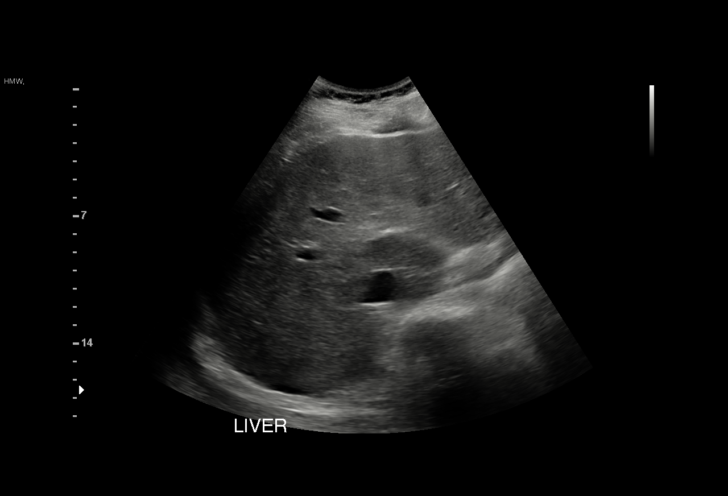
[im 22/74]
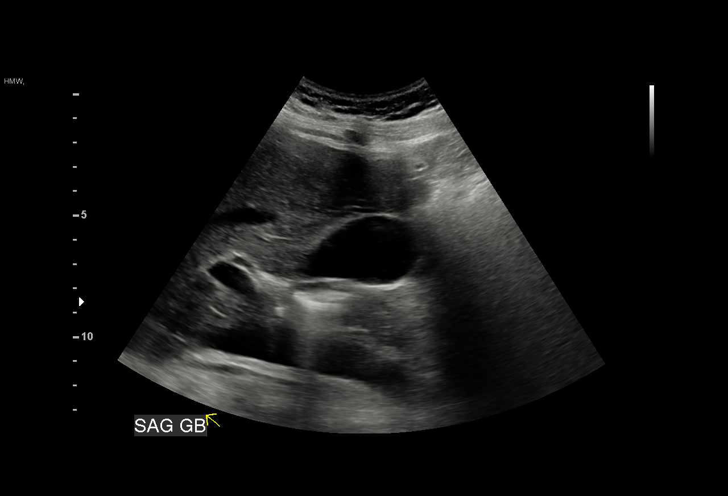
[im 28/74]
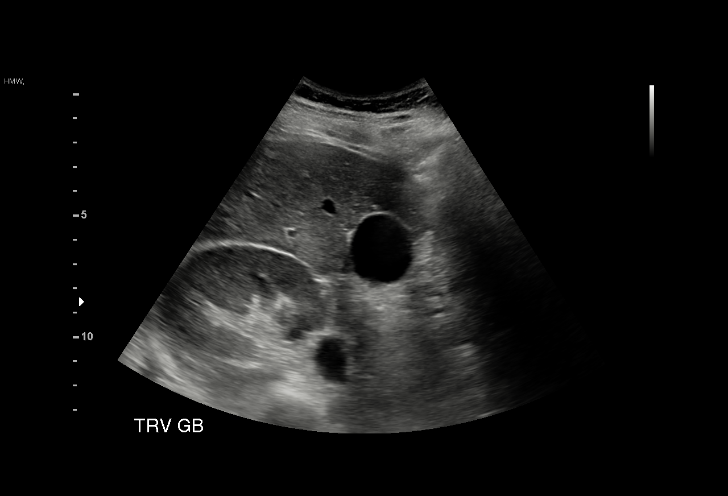
[im 31/74]
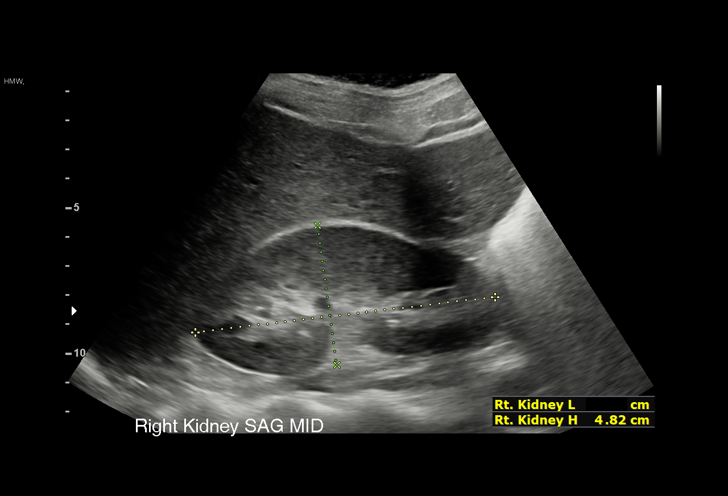
[im 37/74]
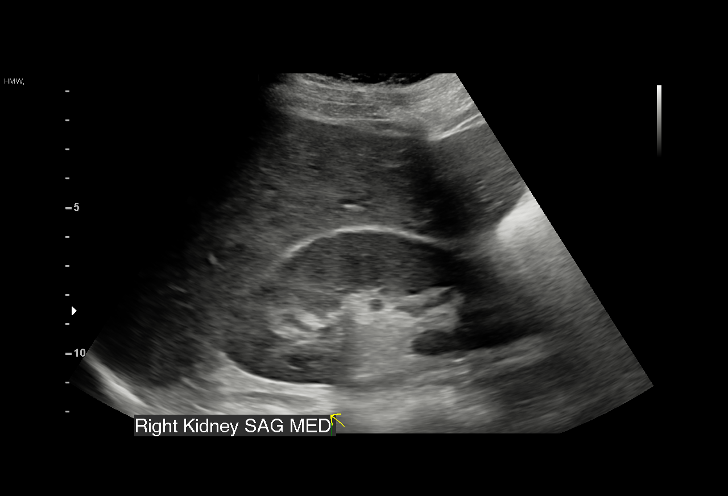
[im 43/74]
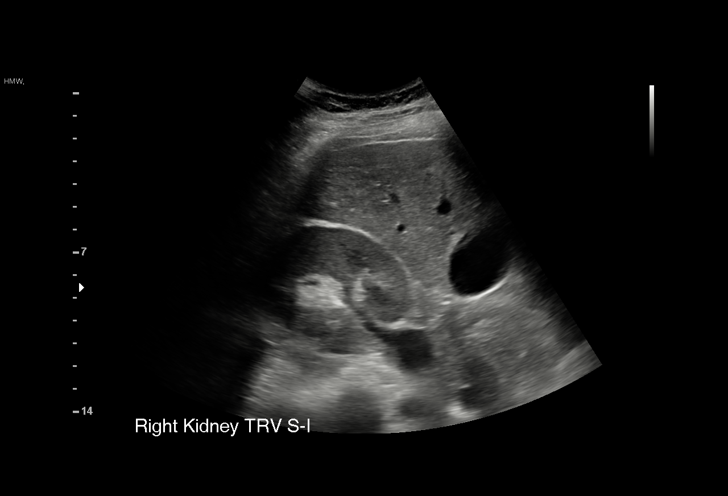
[im 46/74]
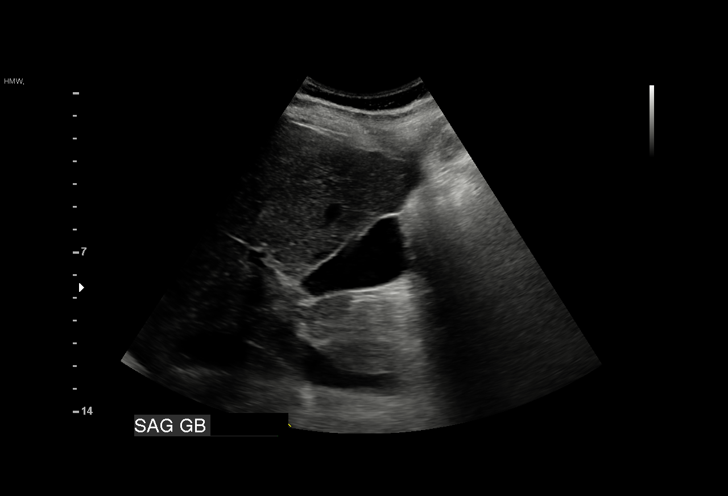
[im 52/74]
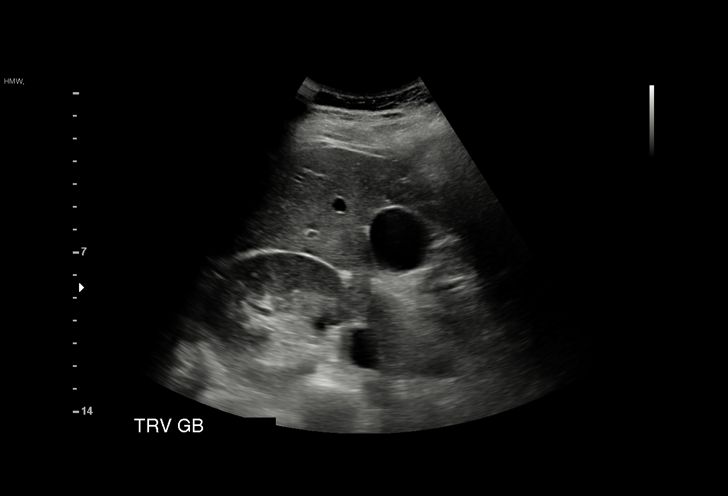
[im 58/74]
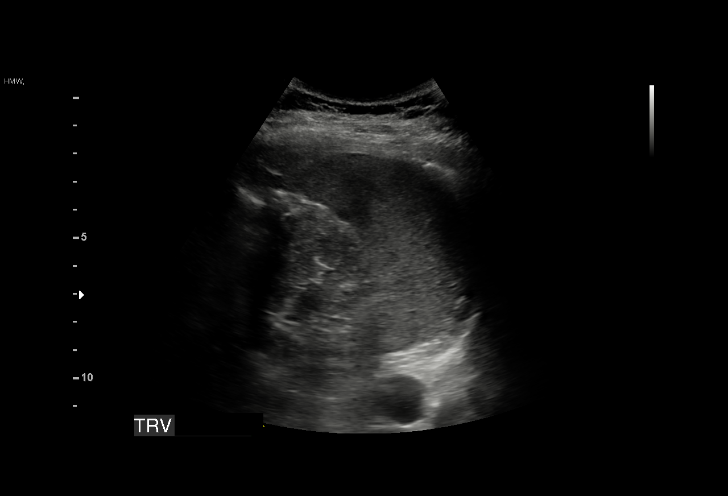
[im 61/74]
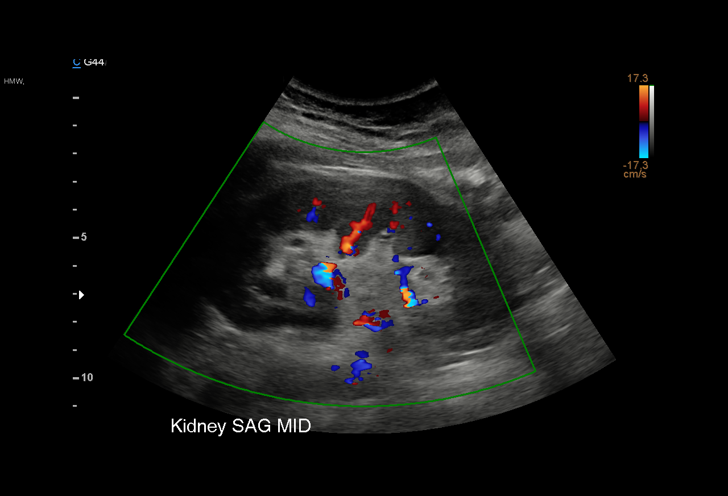
[im 67/74]
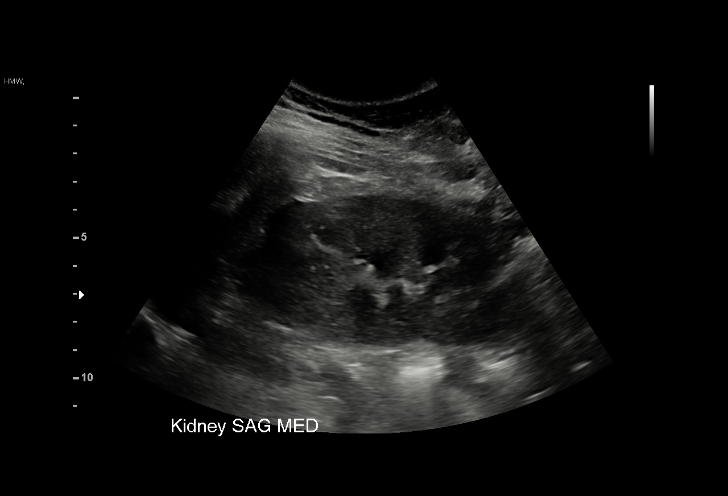
[im 74/74]
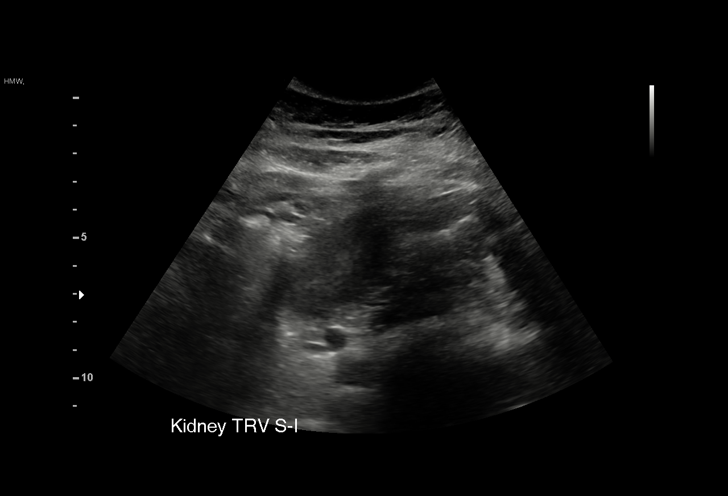

[15 of 25 positions shown; findings below may reference images not displayed]

FINDINGS: Gallbladder: No gallstones or wall thickening visualized. No
sonographic Murphy sign noted by sonographer.

Common bile duct: Diameter: 5-6 mm, normal.

Liver: No focal lesion identified. Within normal limits in
parenchymal echogenicity. Portal vein is patent on color Doppler
imaging with normal direction of blood flow towards the liver.

IVC: No abnormality visualized.

Pancreas: Visualized portion unremarkable.

Spleen: Size and appearance within normal limits.

Right Kidney: Length: 10.3 cm. Echogenicity within normal limits. No
mass or hydronephrosis visualized.

Left Kidney: Length: 10.5 cm. Echogenicity within normal limits. No
mass or hydronephrosis visualized.

Abdominal aorta: No aneurysm visualized.

Other findings: None.
IMPRESSION: Normal ultrasound appearance of the abdomen.

## 2022-07-06 ENCOUNTER — Ambulatory Visit: Payer: Medicaid Other | Admitting: Family Medicine

## 2022-11-24 IMAGING — US US PELVIS COMPLETE WITH TRANSVAGINAL
1 series · 13 of 25 positions shown · non-contrast
Comparison: None available.

CLINICAL DATA: Initial evaluation for bleeding 8 weeks status post
delivery.



[Series 1: us pelvis complete with transvaginal · 80 acquisitions, 13 frames shown]
[im 1/80]
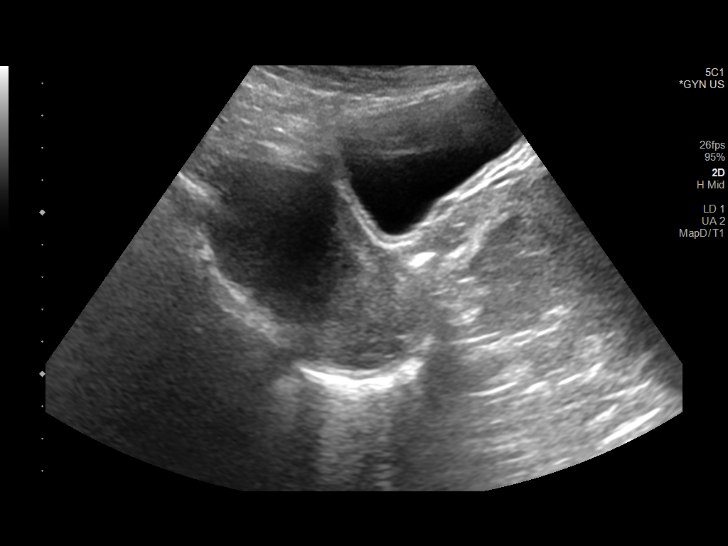
[im 7/80]
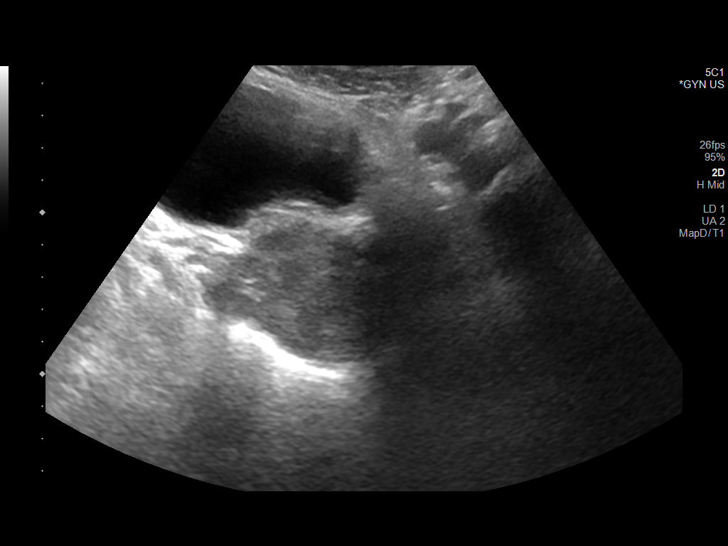
[im 14/80]
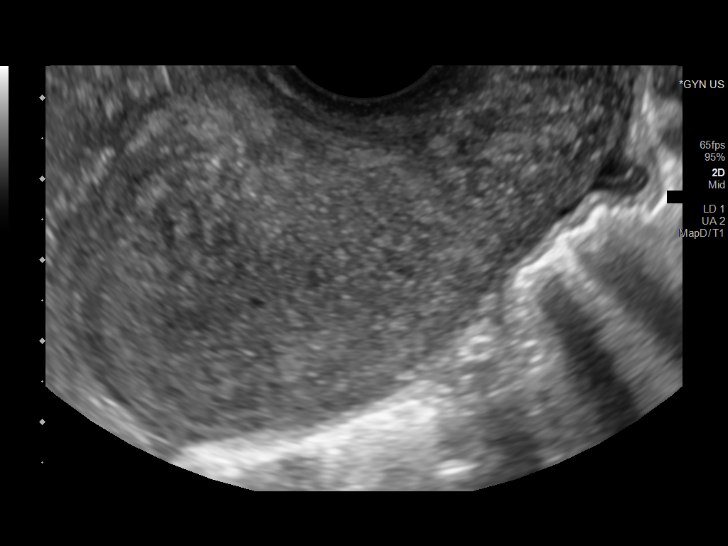
[im 20/80]
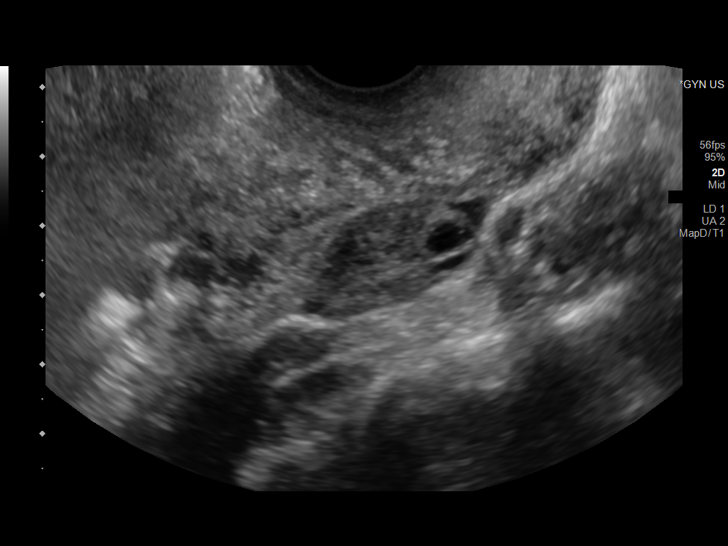
[im 27/80]
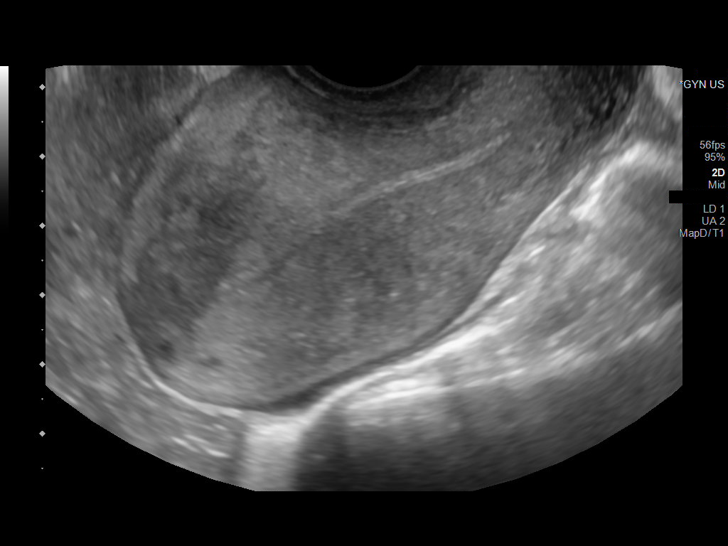
[im 33/80]
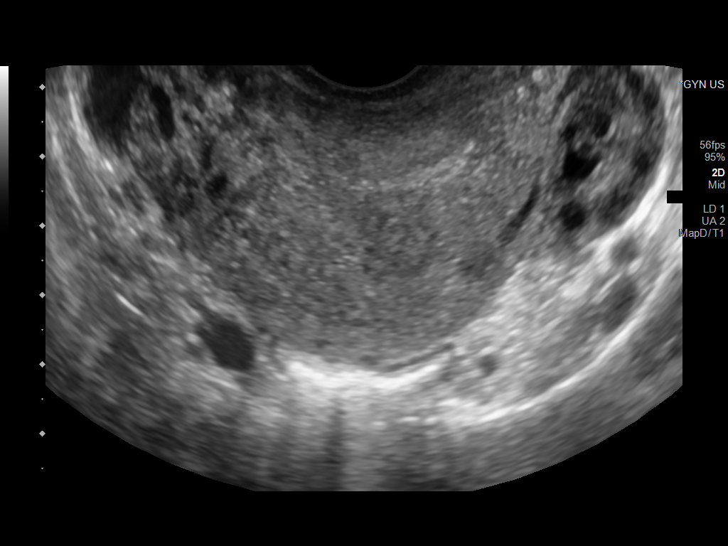
[im 40/80]
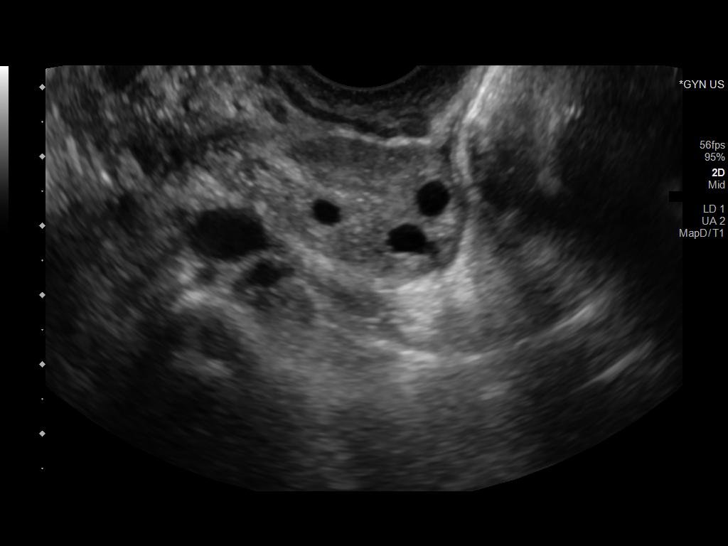
[im 47/80]
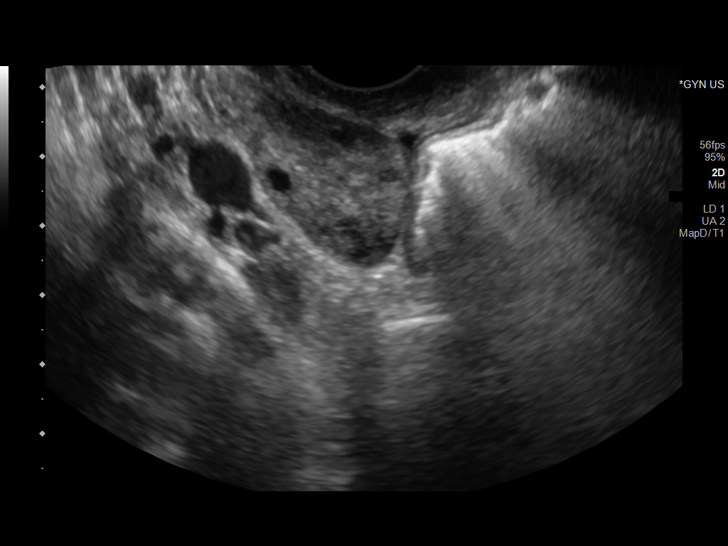
[im 53/80]
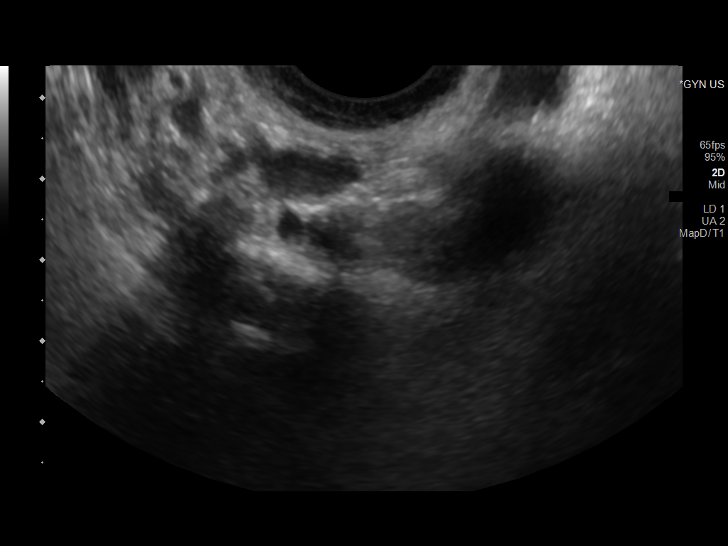
[im 60/80]
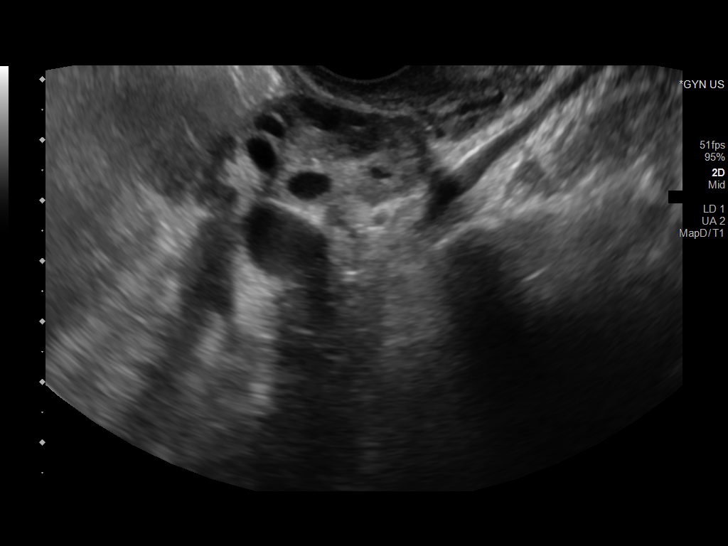
[im 66/80]
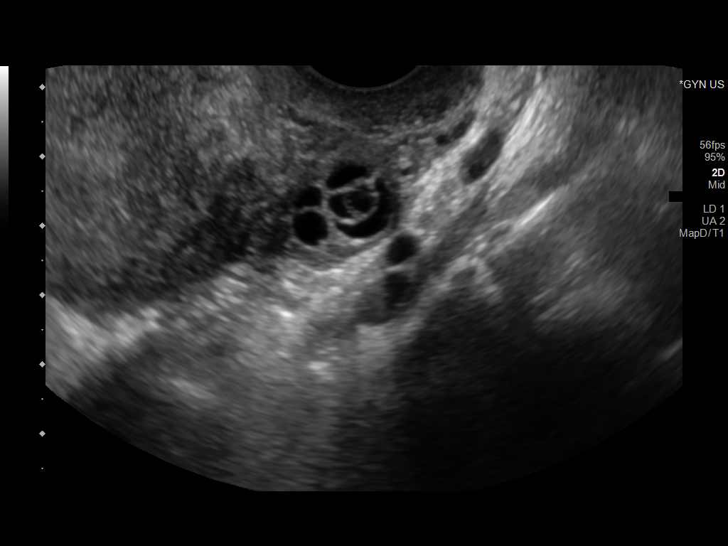
[im 73/80]
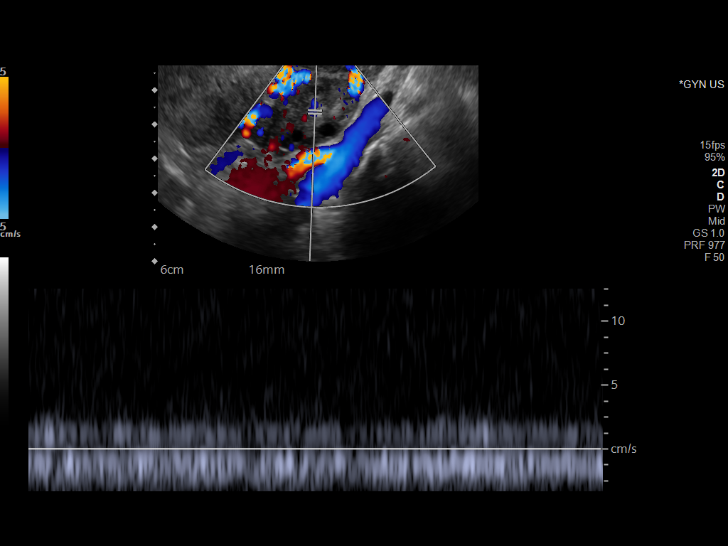
[im 80/80]
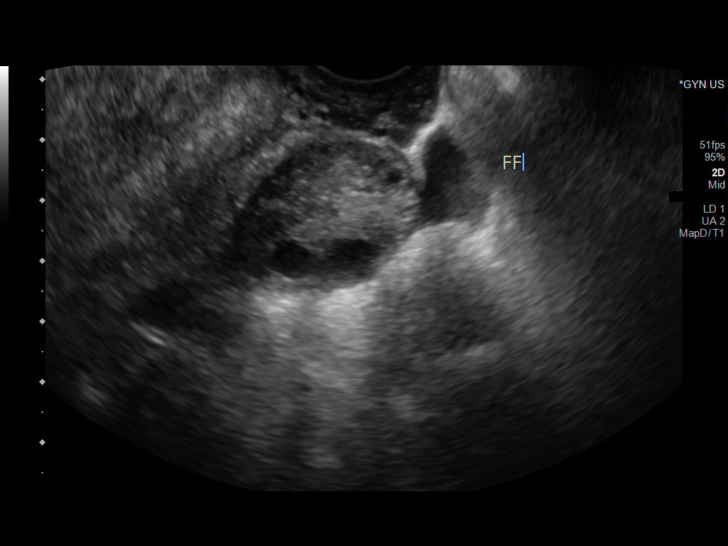

[13 of 25 positions shown; findings below may reference images not displayed]

FINDINGS: Uterus

Measurements: 7.3 x 4.5 x 5.0 cm = volume: 87.0 mL. Uterus is
anteverted. No discrete fibroid or other mass.

Endometrium

Thickness: 3.7 mm. No focal abnormality visualized. Vascularity not
assessed by the sonographer.

Right ovary

Measurements: 3.2 x 2.2 x 2.9 cm = volume: 10.5 mL. Normal
appearance/no adnexal mass.

Left ovary

Measurements: 3.2 x 2.0 x 2.4 cm = volume: 7.8 mL. Normal
appearance/no adnexal mass.

Other findings

Small volume free fluid noted within the pelvis, presumably
physiologic.
IMPRESSION: 1. Endometrial complex within normal limits measuring 3.7 mm in
thickness. No sonographic features to suggest retained products of
conception. If bleeding remains unresponsive to hormonal or medical
therapy, sonohysterogram should be considered for focal lesion
work-up. (Ref: Radiological Reasoning: Algorithmic Workup of
Abnormal Vaginal Bleeding with Endovaginal Sonography and
Sonohysterography. AJR 2669; 191:S68-73).
2. Otherwise unremarkable and normal pelvic ultrasound for age.

## 2023-03-12 ENCOUNTER — Emergency Department (HOSPITAL_BASED_OUTPATIENT_CLINIC_OR_DEPARTMENT_OTHER)
Admission: EM | Admit: 2023-03-12 | Discharge: 2023-03-12 | Disposition: A | Discharge status: Home / Self Care | Payer: Medicaid Other | Attending: Emergency Medicine | Admitting: Emergency Medicine

## 2023-03-12 ENCOUNTER — Other Ambulatory Visit: Payer: Self-pay

## 2023-03-12 DIAGNOSIS — L255 Unspecified contact dermatitis due to plants, except food: Secondary | ICD-10-CM | POA: Diagnosis not present

## 2023-03-12 DIAGNOSIS — R21 Rash and other nonspecific skin eruption: Secondary | ICD-10-CM | POA: Diagnosis present

## 2023-03-12 MED ORDER — PREDNISONE 20 MG PO TABS: 40.0000 mg | ORAL_TABLET | Freq: Every day | ORAL | 0 refills | Status: DC

## 2023-03-12 NOTE — ED Provider Notes (Signed)
[facility] Provider Note   CSN: [id number] Arrival date & time: 03/12/23  1055     History  Chief Complaint  Patient presents with   Rash    Jordan Blair is a 32 y.o. female with overall noncontributory past medical history presents with concern for itching, rash to legs, arms.  Patient reports that on Friday she was helping her grandmother in the yard.  She is concerned about poison ivy or other plant dermatitis.  She reports they are quite itchy.  She is applied hydrocortisone, Neosporin, and calamine lotion, reports no significant relief.  She voices concern that it is spreading towards her groin.  She reports that she has washed all of close that she was wearing, has not worn them again.  She denies any rash on the trunk, face, neck.   Rash      Home Medications Prior to Admission medications   Medication Sig Start Date End Date Taking? Authorizing Provider  predniSONE (DELTASONE) 20 MG tablet Take 2 tablets (40 mg total) by mouth daily. 03/12/23  Yes Elizabeth Haff H, PA-C  Etonogestrel (NEXPLANON [place]) Inject into the skin. Patient not taking: Reported on 05/10/2021    [provider]  ibuprofen (ADVIL) 600 MG tablet Take 1 tablet (600 mg total) by mouth every 6 (six) hours. Patient not taking: No sig reported 02/18/21   Hermina Staggers, MD  levETIRAcetam (KEPPRA) 750 MG tablet Take 1 tablet (750 mg total) by mouth 2 (two) times daily. 03/09/21   Levie Heritage, DO      Allergies    Amoxicillin    Review of Systems   Review of Systems  Skin:  Positive for rash.  All other systems reviewed and are negative.   Physical Exam Updated Vital Signs BP 116/86   Pulse 94   Temp 97.7 F (36.5 C) (Oral)   Resp 17   Ht 5\' 1"  (1.549 m)   Wt 67.6 kg   SpO2 98%   BMI 28.15 kg/m  Physical Exam Vitals and nursing note reviewed.  Constitutional:      General: She is not in acute distress.    Appearance: Normal  appearance.  HENT:     Head: Normocephalic and atraumatic.  Eyes:     General:        Right eye: No discharge.        Left eye: No discharge.  Cardiovascular:     Rate and Rhythm: Normal rate and regular rhythm.  Pulmonary:     Effort: Pulmonary effort is normal. No respiratory distress.  Musculoskeletal:        General: No deformity.  Skin:    General: Skin is warm and dry.     Comments: Patient with some scattered excoriations, red raised bumps in quite spread out distribution on bilateral lower legs, lower ribs.  No vesicular or linear lesions.  No evidence of burrows, bite marks.  No evidence of pus, induration, or secondary infection.  Neurological:     Mental Status: She is alert and oriented to person, place, and time.  Psychiatric:        Mood and Affect: Mood normal.        Behavior: Behavior normal.     ED Results / Procedures / Treatments   Labs (all labs ordered are listed, but only abnormal results are displayed) Labs Reviewed - No data to display  EKG None  Radiology No results found.  Procedures Procedures  Medications Ordered in ED Medications - No data to display  ED Course/ Medical Decision Making/ A&P                             Medical Decision Making  This patient is a 32 y.o. female who presents to the ED for concern of rash.   Differential diagnoses prior to evaluation: Insect infestation, plant dermatitis, contact or otitis, allergic dermatitis, overall low clinical concern for SJS, TEN, based on the scattered excoriations, distribution of rash, low clinical suspicion for a secondary allergic reaction to the Neosporin that she has been applying, no evidence of cellulitis, impetigo  Past Medical History / Social History / Additional history: Chart reviewed. Pertinent results include: Noncontributory  Physical Exam: Physical exam performed. The pertinent findings include: Scattered excoriations and red raised bumps without significant  vesicular lesions, linear rash  Medications / Treatment: Discussed with patient versus conservative watch and wait, continue hydrocortisone, triamcinolone versus short course of steroids.  Given the degree of her itching, concern for plant dermatitis, and the distribution of the rash and think a short course of steroids is reasonable.  Patient without any history of hypertension, hyperglycemia.  Discussed I think that she can stop the Neosporin as there is no evidence of secondary infection, recommended that she stop itching the rash is much as possible, and encourage continued hydrocortisone, calamine.   Disposition: After consideration of the diagnostic results and the patients response to treatment, I feel that patient with nonspecific plant contact dermatitis versus scattered bug bites, versus other, no signs of classic poison ivy dermatitis, excoriations or not distributed around waist, wrists, and without any evidence of bite marks to suggest scabies, chiggers.   emergency department workup does not suggest an emergent condition requiring admission or immediate intervention beyond what has been performed at this time. The plan is: As above. The patient is safe for discharge and has been instructed to return immediately for worsening symptoms, change in symptoms or any other concerns.  Final Clinical Impression(s) / ED Diagnoses Final diagnoses:  Rash  Plant dermatitis    Rx / DC Orders ED Discharge Orders          Ordered    predniSONE (DELTASONE) 20 MG tablet  Daily        03/12/23 1118              Mamie Diiorio, [place], PA-C 03/12/23 1124    Jacalyn Lefevre, MD 03/12/23 1201

## 2023-03-12 NOTE — ED Triage Notes (Addendum)
Patient presents to ED via POV from home. Here with rash to legs and arms. Patient reports on Friday she was out in the yard helping her grandmother. Voices concern for poison ivy.

## 2023-03-12 NOTE — Discharge Instructions (Addendum)
You can continue use hydrocortisone, calamine lotion.  I would stop using Neosporin unless you are concerned about any developing infection.  You can take the entire course of steroids that I prescribed to help with the itching, swelling.  You may have some increased appetite, insomnia, occasionally can have some more moodiness, and swelling especially around the ankles while taking the steroids, the side effects should improve as soon as you are done taking them.

## 2023-12-11 ENCOUNTER — Encounter (HOSPITAL_COMMUNITY): Payer: Self-pay

## 2023-12-11 ENCOUNTER — Emergency Department (HOSPITAL_COMMUNITY)
Admission: EM | Admit: 2023-12-11 | Discharge: 2023-12-11 | Disposition: A | Discharge status: Home / Self Care | Attending: Emergency Medicine | Admitting: Emergency Medicine

## 2023-12-11 ENCOUNTER — Other Ambulatory Visit: Payer: Self-pay

## 2023-12-11 DIAGNOSIS — G8929 Other chronic pain: Secondary | ICD-10-CM

## 2023-12-11 DIAGNOSIS — R519 Headache, unspecified: Secondary | ICD-10-CM | POA: Diagnosis present

## 2023-12-11 LAB — COMPREHENSIVE METABOLIC PANEL
ALT: 16 U/L (ref 0–44)
AST: 17 U/L (ref 15–41)
Albumin: 3.6 g/dL (ref 3.5–5.0)
Alkaline Phosphatase: 71 U/L (ref 38–126)
Anion gap: 12 (ref 5–15)
BUN: <5 mg/dL — ABNORMAL LOW (ref 6–20)
CO2: 26 mmol/L (ref 22–32)
Calcium: 9.7 mg/dL (ref 8.9–10.3)
Chloride: 101 mmol/L (ref 98–111)
Creatinine, Ser: 0.77 mg/dL (ref 0.44–1.00)
GFR, Estimated: >60 mL/min (ref >60)
Glucose, Bld: 113 mg/dL — ABNORMAL HIGH (ref 70–99)
Potassium: 3.1 mmol/L — ABNORMAL LOW (ref 3.5–5.1)
Sodium: 139 mmol/L (ref 135–145)
Total Bilirubin: 0.4 mg/dL (ref 0.0–1.2)
Total Protein: 7.2 g/dL (ref 6.5–8.1)

## 2023-12-11 LAB — I-STAT CG4 LACTIC ACID, ED: Lactic Acid, Venous: 1 mmol/L (ref 0.5–1.9)

## 2023-12-11 LAB — CBC WITH DIFFERENTIAL/PLATELET
Abs Immature Granulocytes: 0.04 10*3/uL (ref 0.00–0.07)
Basophils Absolute: 0 10*3/uL (ref 0.0–0.1)
Basophils Relative: 0 %
Eosinophils Absolute: 0.6 10*3/uL — ABNORMAL HIGH (ref 0.0–0.5)
Eosinophils Relative: 6 %
HCT: 38.5 % (ref 36.0–46.0)
Hemoglobin: 12.9 g/dL (ref 12.0–15.0)
Immature Granulocytes: 0 %
Lymphocytes Relative: 28 %
Lymphs Abs: 2.9 10*3/uL (ref 0.7–4.0)
MCH: 31.1 pg (ref 26.0–34.0)
MCHC: 33.5 g/dL (ref 30.0–36.0)
MCV: 92.8 fL (ref 80.0–100.0)
Monocytes Absolute: 0.7 10*3/uL (ref 0.1–1.0)
Monocytes Relative: 7 %
Neutro Abs: 6.1 10*3/uL (ref 1.7–7.7)
Neutrophils Relative %: 59 %
Platelets: 421 10*3/uL — ABNORMAL HIGH (ref 150–400)
RBC: 4.15 MIL/uL (ref 3.87–5.11)
RDW: 12.6 % (ref 11.5–15.5)
WBC: 10.4 10*3/uL (ref 4.0–10.5)
nRBC: 0 % (ref 0.0–0.2)

## 2023-12-11 LAB — HCG, SERUM, QUALITATIVE: Preg, Serum: NEGATIVE

## 2023-12-11 MED ORDER — METOCLOPRAMIDE HCL 5 MG/ML IJ SOLN
10.0000 mg | Freq: Once | INTRAMUSCULAR | Status: AC
  Administered 2023-12-11: 10 mg via INTRAMUSCULAR
  Filled 2023-12-11: qty 2

## 2023-12-11 MED ORDER — OXYCODONE HCL 5 MG PO TABS
5.0000 mg | ORAL_TABLET | Freq: Once | ORAL | Status: AC
  Administered 2023-12-11: 5 mg via ORAL
  Filled 2023-12-11: qty 1

## 2023-12-11 MED ORDER — KETOROLAC TROMETHAMINE 15 MG/ML IJ SOLN
30.0000 mg | Freq: Once | INTRAMUSCULAR | Status: DC
  Filled 2023-12-11: qty 2

## 2023-12-11 MED ORDER — OXYCODONE-ACETAMINOPHEN 5-325 MG PO TABS
1.0000 | ORAL_TABLET | ORAL | Status: DC | PRN
  Administered 2023-12-11: 1 via ORAL
  Filled 2023-12-11: qty 1

## 2023-12-11 NOTE — ED Triage Notes (Signed)
 Pt states she has been having headaches, thought she may have had a seizure. Pt has swelling around right eye for "awhile". Pt states she has seen PCP for same.

## 2023-12-11 NOTE — ED Provider Notes (Signed)
 [place] EMERGENCY DEPARTMENT AT [facility] Provider Note   CSN: [id number] Arrival date & time: 12/11/23  1028     History  Chief Complaint  Patient presents with   Headache    Jordan Blair is a 33 y.o. female.  Patient with h/o cervical dysplasia, right parietal cavernoma, epilepsy on levetiracetam --presents to the emergency department for ongoing bilateral headache.  Patient thinks that she may have had a seizure today.  She states that she has been compliant with her Keppra.  Headache is bilateral temples.  Sometimes she has swelling.  This is acute on chronic for the patient.  She is scheduled to have another procedure on the cavernoma, she voices frustration that her procedure is now being pushed out until June.       Home Medications Prior to Admission medications   Medication Sig Start Date End Date Taking? Authorizing Provider  busPIRone (BUSPAR) 10 MG tablet Take 10 mg by mouth in the morning and at bedtime.   Yes [provider]  folic acid (FOLVITE) 1 MG tablet Take 1 mg by mouth daily.   Yes [provider]  levETIRAcetam (KEPPRA XR) 500 MG 24 hr tablet Take 2,500 mg by mouth at bedtime.   Yes [provider]  Midazolam (NAYZILAM) 5 MG/0.1ML SOLN Place 5 mg into the nose as needed (seizures > 3 minutes).   Yes [provider]  levETIRAcetam (KEPPRA) 750 MG tablet Take 1 tablet (750 mg total) by mouth 2 (two) times daily. Patient not taking: Reported on 12/11/2023 03/09/21   Levie Heritage, DO      Allergies    Amoxil [amoxicillin], Benadryl [diphenhydramine], and Motrin [ibuprofen]    Review of Systems   Review of Systems  Physical Exam Updated Vital Signs BP 119/87 (BP Location: Left Arm)   Pulse 82   Temp 98.6 F (37 C)   Resp 14   Ht 5' (1.524 m)   Wt 67 kg   LMP 12/11/2023 (Exact Date)   SpO2 100%   BMI 28.85 kg/m   Physical Exam Vitals and nursing note reviewed.  Constitutional:       Appearance: She is well-developed.  HENT:     Head: Normocephalic and atraumatic.     Right Ear: External ear normal.     Left Ear: External ear normal.     Nose: Nose normal.     Mouth/Throat:     Pharynx: Uvula midline.  Eyes:     General: Lids are normal.     Extraocular Movements:     Right eye: No nystagmus.     Left eye: No nystagmus.     Conjunctiva/sclera: Conjunctivae normal.     Pupils: Pupils are equal, round, and reactive to light.  Cardiovascular:     Rate and Rhythm: Normal rate and regular rhythm.  Pulmonary:     Effort: Pulmonary effort is normal.     Breath sounds: Normal breath sounds.  Abdominal:     Palpations: Abdomen is soft.     Tenderness: There is no abdominal tenderness.  Musculoskeletal:     Cervical back: Normal range of motion and neck supple. No tenderness or bony tenderness.  Skin:    General: Skin is warm and dry.  Neurological:     Mental Status: She is alert and oriented to person, place, and time.     GCS: GCS eye subscore is 4. GCS verbal subscore is 5. GCS motor subscore is 6.  Cranial Nerves: No cranial nerve deficit.     Sensory: No sensory deficit.     Motor: No weakness.     Coordination: Coordination normal.     Gait: Gait normal.     Comments: Upper extremity myotomes tested bilaterally:  C5 Shoulder abduction 5/5 C6 Elbow flexion/wrist extension 5/5 C7 Elbow extension 5/5 C8 Finger flexion 5/5 T1 Finger abduction 5/5  Lower extremity myotomes tested bilaterally: L2 Hip flexion 5/5 L3 Knee extension 5/5 L4 Ankle dorsiflexion 5/5 S1 Ankle plantar flexion 5/5      ED Results / Procedures / Treatments   Labs (all labs ordered are listed, but only abnormal results are displayed) Labs Reviewed  COMPREHENSIVE METABOLIC PANEL - Abnormal; Notable for the following components:      Result Value   Potassium 3.1 (*)    Glucose, Bld 113 (*)    BUN <5 (*)    All other components within normal limits  CBC WITH  DIFFERENTIAL/PLATELET - Abnormal; Notable for the following components:   Platelets 421 (*)    Eosinophils Absolute 0.6 (*)    All other components within normal limits  HCG, SERUM, QUALITATIVE  LEVETIRACETAM LEVEL  URINALYSIS, W/ REFLEX TO CULTURE (INFECTION SUSPECTED)  I-STAT CG4 LACTIC ACID, ED    EKG None  Radiology No results found.  Procedures Procedures    Medications Ordered in ED Medications  oxyCODONE-acetaminophen (PERCOCET/ROXICET) 5-325 MG per tablet 1 tablet (1 tablet Oral Given 12/11/23 1116)  oxyCODONE (Oxy IR/ROXICODONE) immediate release tablet 5 mg (5 mg Oral Given 12/11/23 1522)  metoCLOPramide (REGLAN) injection 10 mg (10 mg Intramuscular Given 12/11/23 1523)    ED Course/ Medical Decision Making/ A&P    Patient seen and examined. History obtained directly from patient.  Reviewed recent CT imaging performed at outside facility, CT head, 2/27 that was negative for acute findings.  Labs/EKG: Ordered CBC unremarkable with white blood cell count normal high at 10.4; CMP with potassium low at 3.1; glucose 113; normal at 1.0; negative pregnancy.  Imaging: None ordered.  Medications/Fluids: Ordered: P.o. oxycodone, IM Reglan  Most recent vital signs reviewed and are as follows: BP 119/87 (BP Location: Left Arm)   Pulse 82   Temp 98.6 F (37 C)   Resp 14   Ht 5' (1.524 m)   Wt 67 kg   LMP 12/11/2023 (Exact Date)   SpO2 100%   BMI 28.85 kg/m   Initial impression: Chronic pain, possible breakthrough seizure.  Patient currently being seen in [facility], being set up for neurosurgery for treatment of cerebral cavernoma.  Normal reassuring exam today.  4:00 PM patient requesting discharge.  She was received IM Reglan and p.o. oxycodone.  Most current vital signs reviewed and are as follows: BP (!) 132/99   Pulse 67   Temp 97.9 F (36.6 C) (Oral)   Resp 17   Ht 5' (1.524 m)   Wt 67 kg   LMP 12/11/2023 (Exact Date)   SpO2 100%   BMI 28.85 kg/m    Plan: Discharge to home.   Prescriptions written for: None  Other home care instructions discussed: Avoidance of triggers  ED return instructions discussed: Recurrent seizures, counseled to return if they have weakness in their arms or legs, slurred speech, trouble walking or talking, confusion, trouble with their balance, or if they have any other concerns. Patient verbalizes understanding and agrees with plan.   Follow-up instructions discussed: Patient encouraged to follow-up with their PCP in 2 days.  Medical Decision Making Amount and/or Complexity of Data Reviewed Labs: ordered.  Risk Prescription drug management.   In regards to the patient's headache, critical differentials were considered including subarachnoid hemorrhage, intracerebral hemorrhage, epidural/subdural hematoma, pituitary apoplexy, vertebral/carotid artery dissection, giant cell arteritis, central venous thrombosis, reversible cerebral vasoconstriction, acute angle closure glaucoma, idiopathic intracranial hypertension, bacterial meningitis, viral encephalitis, carbon monoxide poisoning, posterior reversible encephalopathy syndrome, pre-eclampsia.   Reg flag symptoms related to these causes were considered including systemic symptoms (fever, weight loss), neurologic symptoms (confusion, mental status change, vision change, associated seizure), acute or sudden "thunderclap" onset, patient age 51 or older with new or progressive headache, patient of any age with first headache or change in headache pattern, pregnant or postpartum status, history of HIV or other immunocompromise, history of cancer, headache occurring with exertion, associated neck or shoulder pain, associated traumatic injury, concurrent use of anticoagulation, family history of spontaneous SAH, and concurrent drug use.    Other benign, more common causes of headache were considered including migraine, tension-type  headache, cluster headache, referred pain from other cause such as sinus infection, dental pain, trigeminal neuralgia.   On exam, patient has a reassuring neuro exam including baseline mental status, no significant neck pain or meningeal signs, no signs of severe infection or fever.   Patient has a cavernoma.  Most recent CT was stable, performed at outside facility about a week ago.  She has a normal neurologic exam.  Her symptoms seem to be acute on chronic.  She has had similar head pain ongoing for some time now.  She is scheduled for neurosurgical procedure.  No indications for emergent neurology or neurosurgical evaluation at this time.  The patient's vital signs, pertinent lab work and imaging were reviewed and interpreted as discussed in the ED course. Hospitalization was considered for further testing, treatments, or serial exams/observation. However as patient is well-appearing, has a stable exam over the course of their evaluation, and reassuring studies today, I do not feel that they warrant admission at this time. This plan was discussed with the patient who verbalizes agreement and comfort with this plan and seems reliable and able to return to the Emergency Department with worsening or changing symptoms.          Final Clinical Impression(s) / ED Diagnoses Final diagnoses:  Chronic nonintractable headache, unspecified headache type    Rx / DC Orders ED Discharge Orders     None         Renne Crigler, PA-C 12/11/23 1601    Horton, Clabe Seal, DO 12/11/23 2328

## 2023-12-11 NOTE — Discharge Instructions (Addendum)
 Please read and follow all provided instructions.  Your diagnoses today include:  1. Chronic nonintractable headache, unspecified headache type     Tests performed today include: Complete blood cell count: No problems Basic metabolic panel: Potassium was slightly low otherwise unremarkable Lactic acid test: Was normal Vital signs. See below for your results today.   Medications:  In the Emergency Department you received: Reglan - antinausea/headache medication Oxycodone - oral dose of pain medicine  Take any prescribed medications only as directed.  Additional information:  Follow any educational materials contained in this packet.  You are having a headache. No specific cause was found today for your headache. It may have been a migraine or other cause of headache. Stress, anxiety, fatigue, and depression are common triggers for headaches.   Your headache today does not appear to be life-threatening or require hospitalization, but often the exact cause of headaches is not determined in the emergency department. Therefore, follow-up with your doctor is very important to find out what may have caused your headache and whether or not you need any further diagnostic testing or treatment.   Sometimes headaches can appear benign (not harmful), but then more serious symptoms can develop which should prompt an immediate re-evaluation by your doctor or the emergency department.  BE VERY CAREFUL not to take multiple medicines containing Tylenol (also called acetaminophen). Doing so can lead to an overdose which can damage your liver and cause liver failure and possibly death.   Follow-up instructions: Please follow-up with your primary care provider in the next 3 days for further evaluation of your symptoms.   Return instructions:  Please return to the Emergency Department if you experience worsening symptoms. Return if the medications do not resolve your headache, if it recurs, or if you  have multiple episodes of vomiting or cannot keep down fluids. Return if you have a change from the usual headache. RETURN IMMEDIATELY IF you: Develop a sudden, severe headache Develop confusion or become poorly responsive or faint Develop a fever above 100.26F or problem breathing Have a change in speech, vision, swallowing, or understanding Develop new weakness, numbness, tingling, incoordination in your arms or legs Have a seizure Please return if you have any other emergent concerns.  Additional Information:  Your vital signs today were: BP (!) 132/99   Pulse 67   Temp 97.9 F (36.6 C) (Oral)   Resp 17   Ht 5' (1.524 m)   Wt 67 kg   LMP 12/11/2023 (Exact Date)   SpO2 100%   BMI 28.85 kg/m  If your blood pressure (BP) was elevated above 135/85 this visit, please have this repeated by your doctor within one month. --------------

## 2023-12-12 LAB — LEVETIRACETAM LEVEL: Levetiracetam Lvl: 41.7 ug/mL — ABNORMAL HIGH (ref 10.0–40.0)

## 2024-03-25 ENCOUNTER — Encounter (HOSPITAL_BASED_OUTPATIENT_CLINIC_OR_DEPARTMENT_OTHER): Payer: Self-pay | Admitting: *Deleted

## 2024-03-25 ENCOUNTER — Other Ambulatory Visit: Payer: Self-pay

## 2024-03-25 ENCOUNTER — Emergency Department (HOSPITAL_BASED_OUTPATIENT_CLINIC_OR_DEPARTMENT_OTHER): Admission: EM | Admit: 2024-03-25 | Discharge: 2024-03-25 | Disposition: A | Discharge status: Home / Self Care

## 2024-03-25 DIAGNOSIS — R569 Unspecified convulsions: Secondary | ICD-10-CM | POA: Insufficient documentation

## 2024-03-25 LAB — BASIC METABOLIC PANEL WITH GFR
Anion gap: 17 — ABNORMAL HIGH (ref 5–15)
BUN: 11 mg/dL (ref 6–20)
CO2: 21 mmol/L — ABNORMAL LOW (ref 22–32)
Calcium: 9.3 mg/dL (ref 8.9–10.3)
Chloride: 102 mmol/L (ref 98–111)
Creatinine, Ser: 0.8 mg/dL (ref 0.44–1.00)
GFR, Estimated: >60 mL/min (ref >60)
Glucose, Bld: 120 mg/dL — ABNORMAL HIGH (ref 70–99)
Potassium: 3.4 mmol/L — ABNORMAL LOW (ref 3.5–5.1)
Sodium: 140 mmol/L (ref 135–145)

## 2024-03-25 LAB — CBC
HCT: 38.1 % (ref 36.0–46.0)
Hemoglobin: 12.7 g/dL (ref 12.0–15.0)
MCH: 31 pg (ref 26.0–34.0)
MCHC: 33.3 g/dL (ref 30.0–36.0)
MCV: 92.9 fL (ref 80.0–100.0)
Platelets: 337 10*3/uL (ref 150–400)
RBC: 4.1 MIL/uL (ref 3.87–5.11)
RDW: 13.1 % (ref 11.5–15.5)
WBC: 6.1 10*3/uL (ref 4.0–10.5)
nRBC: 0 % (ref 0.0–0.2)

## 2024-03-25 LAB — HCG, SERUM, QUALITATIVE: Preg, Serum: NEGATIVE

## 2024-03-25 LAB — ETHANOL: Alcohol, Ethyl (B): <15 mg/dL (ref <15)

## 2024-03-25 MED ORDER — LEVETIRACETAM (KEPPRA) 500 MG/5 ML ADULT IV PUSH
1000.0000 mg | Freq: Once | INTRAVENOUS | Status: AC
  Administered 2024-03-25: 1000 mg via INTRAVENOUS
  Filled 2024-03-25: qty 10

## 2024-03-25 MED ORDER — SODIUM CHLORIDE 0.9 % IV BOLUS
1000.0000 mL | Freq: Once | INTRAVENOUS | Status: AC
  Administered 2024-03-25: 1000 mL via INTRAVENOUS

## 2024-03-25 MED ORDER — ONDANSETRON HCL 4 MG/2ML IJ SOLN
4.0000 mg | Freq: Once | INTRAMUSCULAR | Status: AC
  Administered 2024-03-25: 4 mg via INTRAVENOUS
  Filled 2024-03-25: qty 2
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
## 2024-03-25 NOTE — Discharge Instructions (Signed)
 Please continue your Keppra  as prescribed.  Please do not drive until you are given the okay from your neurologist.  Return to the emergency department for worsening symptoms.

## 2024-10-19 ENCOUNTER — Emergency Department (HOSPITAL_BASED_OUTPATIENT_CLINIC_OR_DEPARTMENT_OTHER)
Admission: EM | Admit: 2024-10-19 | Discharge: 2024-10-19 | Disposition: A | Discharge status: Home / Self Care | Payer: MEDICAID | Attending: Emergency Medicine | Admitting: Emergency Medicine

## 2024-10-19 ENCOUNTER — Other Ambulatory Visit: Payer: Self-pay

## 2024-10-19 ENCOUNTER — Encounter (HOSPITAL_BASED_OUTPATIENT_CLINIC_OR_DEPARTMENT_OTHER): Payer: Self-pay

## 2024-10-19 DIAGNOSIS — R059 Cough, unspecified: Secondary | ICD-10-CM | POA: Diagnosis present

## 2024-10-19 DIAGNOSIS — R0981 Nasal congestion: Secondary | ICD-10-CM | POA: Insufficient documentation

## 2024-10-19 DIAGNOSIS — Z20828 Contact with and (suspected) exposure to other viral communicable diseases: Secondary | ICD-10-CM | POA: Insufficient documentation

## 2024-10-19 LAB — RESP PANEL BY RT-PCR (RSV, FLU A&B, COVID)  RVPGX2
Influenza A by PCR: NEGATIVE
Influenza B by PCR: NEGATIVE
Resp Syncytial Virus by PCR: POSITIVE — AB
SARS Coronavirus 2 by RT PCR: NEGATIVE

## 2024-10-19 NOTE — ED Provider Notes (Signed)
 " [place] EMERGENCY DEPARTMENT AT [facility] Provider Note   CSN: [id number] Arrival date & time: 10/19/24  2041     Patient presents with: Follow-up   Jordan Blair is a 34 y.o. female here presenting with flu exposure.  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  Patient has been having cough and congestion for several days.  Patient was told that she needs to be tested for the flu.  Denies any fevers or chills or trouble breathing.  Denies any history of asthma   The history is provided by the patient.       Prior to Admission medications  Medication Sig Start Date End Date Taking? Authorizing Provider  busPIRone (BUSPAR) 10 MG tablet Take 10 mg by mouth in the morning and at bedtime.    [provider]  folic acid  (FOLVITE ) 1 MG tablet Take 1 mg by mouth daily.    [provider]  levETIRAcetam  (KEPPRA  XR) 500 MG 24 hr tablet Take 2,500 mg by mouth at bedtime.    [provider]  levETIRAcetam  (KEPPRA ) 750 MG tablet Take 1 tablet (750 mg total) by mouth 2 (two) times daily. Patient not taking: Reported on 12/11/2023 03/09/21   Stinson, Jacob J, DO  Midazolam (NAYZILAM) 5 MG/0.1ML SOLN Place 5 mg into the nose as needed (seizures > 3 minutes).    [provider]    Allergies: Amoxil  [amoxicillin ], Benadryl  [diphenhydramine ], and Motrin  [ibuprofen ]    Review of Systems  HENT:  Positive for congestion.   Respiratory:  Positive for cough.   All other systems reviewed and are negative.   Updated Vital Signs BP (!) 121/95   Pulse 85   Temp 98.5 F (36.9 C) (Oral)   Resp 16   LMP 10/17/2024 (Approximate)   SpO2 100%   Physical Exam Vitals and nursing note reviewed.  HENT:     Head: Normocephalic.     Nose: Congestion present.     Mouth/Throat:     Mouth: Mucous membranes are moist.  Eyes:     Extraocular Movements: Extraocular movements intact.      Pupils: Pupils are equal, round, and reactive to light.  Cardiovascular:     Rate and Rhythm: Normal rate and regular rhythm.     Pulses: Normal pulses.  Pulmonary:     Effort: Pulmonary effort is normal.     Breath sounds: Normal breath sounds.  Abdominal:     General: Abdomen is flat.     Palpations: Abdomen is soft.  Musculoskeletal:        General: Normal range of motion.     Cervical back: Normal range of motion.  Skin:    General: Skin is warm.     Capillary Refill: Capillary refill takes less than 2 seconds.  Neurological:     General: No focal deficit present.     Mental Status: She is alert.  Psychiatric:        Mood and Affect: Mood normal.        Behavior: Behavior normal.     (all labs ordered are listed, but only abnormal results are displayed) Labs Reviewed  RESP PANEL BY RT-PCR (RSV, FLU A&B, COVID)  [id number]    EKG: None  Radiology: No results found.   Procedures   Medications Ordered in the ED - No data to display  Medical Decision Making Jordan Blair is a 34 y.o. female here with flu exposure.  Patient is well-appearing and febrile and not hypoxic.  Patient swabbed for flu and COVID and RSV.  Told her to follow-up results on MyChart.     Final diagnoses:  Exposure to the flu    ED Discharge Orders     None          Jordan Alm Macho, MD 10/19/24 2105  "

## 2024-10-19 NOTE — Discharge Instructions (Signed)
 You were swabbed for flu and you can see your results on MyChart in several hours  See your doctor   Return to ER if you have worse trouble breathing or cough or fever

## 2024-10-19 NOTE — ED Triage Notes (Signed)
 Pt presents via POV c/o possible exposure to flu at work. Denies symptoms.
